# Patient Record
Sex: Female | Born: 1980 | Race: Black or African American | Hispanic: No | Marital: Single | Smoking: Never smoker
Health system: Southern US, Community
[De-identification: ages and names within clinical notes are randomized; demographics above are authoritative.]

## PROBLEM LIST (undated history)

## (undated) DIAGNOSIS — I10 Essential (primary) hypertension: Secondary | ICD-10-CM

## (undated) DIAGNOSIS — G43909 Migraine, unspecified, not intractable, without status migrainosus: Secondary | ICD-10-CM

## (undated) DIAGNOSIS — G8929 Other chronic pain: Secondary | ICD-10-CM

## (undated) DIAGNOSIS — K029 Dental caries, unspecified: Secondary | ICD-10-CM

## (undated) DIAGNOSIS — M549 Dorsalgia, unspecified: Secondary | ICD-10-CM

## (undated) DIAGNOSIS — K279 Peptic ulcer, site unspecified, unspecified as acute or chronic, without hemorrhage or perforation: Secondary | ICD-10-CM

## (undated) DIAGNOSIS — E079 Disorder of thyroid, unspecified: Secondary | ICD-10-CM

## (undated) DIAGNOSIS — N390 Urinary tract infection, site not specified: Secondary | ICD-10-CM

## (undated) DIAGNOSIS — K5090 Crohn's disease, unspecified, without complications: Secondary | ICD-10-CM

## (undated) DIAGNOSIS — G35 Multiple sclerosis: Secondary | ICD-10-CM

## (undated) HISTORY — PX: TUBAL LIGATION: SHX77

## (undated) HISTORY — PX: CHOLECYSTECTOMY: SHX55

## (undated) HISTORY — PX: MOUTH SURGERY: SHX715

---

## 2009-01-05 ENCOUNTER — Emergency Department (HOSPITAL_COMMUNITY): Admission: EM | Admit: 2009-01-05 | Discharge: 2009-01-05 | Payer: Self-pay | Admitting: Emergency Medicine

## 2009-02-27 ENCOUNTER — Emergency Department (HOSPITAL_COMMUNITY): Admission: EM | Admit: 2009-02-27 | Discharge: 2009-02-27 | Payer: Self-pay | Admitting: Emergency Medicine

## 2009-03-30 ENCOUNTER — Emergency Department (HOSPITAL_COMMUNITY): Admission: EM | Admit: 2009-03-30 | Discharge: 2009-03-30 | Payer: Self-pay | Admitting: Emergency Medicine

## 2011-05-15 ENCOUNTER — Emergency Department (HOSPITAL_COMMUNITY)
Admission: EM | Admit: 2011-05-15 | Discharge: 2011-05-15 | Disposition: A | Discharge status: Home / Self Care | Payer: Medicaid - Out of State | Attending: Emergency Medicine | Admitting: Emergency Medicine

## 2011-05-15 DIAGNOSIS — G35 Multiple sclerosis: Secondary | ICD-10-CM | POA: Insufficient documentation

## 2011-05-15 DIAGNOSIS — E039 Hypothyroidism, unspecified: Secondary | ICD-10-CM | POA: Insufficient documentation

## 2011-05-15 DIAGNOSIS — M545 Low back pain, unspecified: Secondary | ICD-10-CM | POA: Insufficient documentation

## 2011-05-26 ENCOUNTER — Encounter: Payer: Self-pay | Admitting: *Deleted

## 2011-05-26 ENCOUNTER — Emergency Department (HOSPITAL_COMMUNITY)
Admission: EM | Admit: 2011-05-26 | Discharge: 2011-05-26 | Disposition: A | Discharge status: Home / Self Care | Payer: Medicaid - Out of State | Attending: Emergency Medicine | Admitting: Emergency Medicine

## 2011-05-26 DIAGNOSIS — IMO0001 Reserved for inherently not codable concepts without codable children: Secondary | ICD-10-CM | POA: Insufficient documentation

## 2011-05-26 DIAGNOSIS — K0889 Other specified disorders of teeth and supporting structures: Secondary | ICD-10-CM

## 2011-05-26 DIAGNOSIS — R51 Headache: Secondary | ICD-10-CM | POA: Insufficient documentation

## 2011-05-26 DIAGNOSIS — M2550 Pain in unspecified joint: Secondary | ICD-10-CM | POA: Insufficient documentation

## 2011-05-26 DIAGNOSIS — K089 Disorder of teeth and supporting structures, unspecified: Secondary | ICD-10-CM | POA: Insufficient documentation

## 2011-05-26 DIAGNOSIS — H9209 Otalgia, unspecified ear: Secondary | ICD-10-CM | POA: Insufficient documentation

## 2011-05-26 HISTORY — DX: Multiple sclerosis: G35

## 2011-05-26 HISTORY — DX: Essential (primary) hypertension: I10

## 2011-05-26 HISTORY — DX: Disorder of thyroid, unspecified: E07.9

## 2011-05-26 MED ORDER — ERYTHROMYCIN BASE 250 MG PO TABS: ORAL_TABLET | ORAL | Status: DC

## 2011-05-26 MED ORDER — HYDROCODONE-ACETAMINOPHEN 5-325 MG PO TABS: ORAL_TABLET | ORAL | Status: DC

## 2011-05-26 MED ORDER — ERYTHROMYCIN BASE 250 MG PO TABS
250.0000 mg | ORAL_TABLET | Freq: Once | ORAL | Status: AC
  Administered 2011-05-26: 250 mg via ORAL
  Filled 2011-05-26: qty 1

## 2011-05-26 MED ORDER — HYDROCODONE-ACETAMINOPHEN 5-325 MG PO TABS
2.0000 | ORAL_TABLET | Freq: Once | ORAL | Status: AC
  Administered 2011-05-26: 2 via ORAL
  Filled 2011-05-26: qty 2

## 2011-05-26 MED ORDER — PROMETHAZINE HCL 12.5 MG PO TABS
12.5000 mg | ORAL_TABLET | Freq: Once | ORAL | Status: AC
  Administered 2011-05-26: 12.5 mg via ORAL
  Filled 2011-05-26: qty 1

## 2011-05-26 NOTE — ED Provider Notes (Signed)
History     CSN: [id number] Arrival date & time: 05/26/2011  6:45 PM  Chief Complaint  Patient presents with  . Dental Pain  . Otalgia  . Migraine    (Consider location/radiation/quality/duration/timing/severity/associated sxs/prior treatment) HPI Comments: Pt reports a molar broke on 10/5 while eating. She has had pain since that time. She now has tooth pain and headache.  Patient is a 30 y.o. female presenting with tooth pain. The history is provided by the patient.  Dental PainPrimary symptoms do not include shortness of breath or cough. The symptoms began 3 to 5 days ago. The symptoms are worsening. The symptoms occur constantly.  Additional symptoms include: dental sensitivity to temperature, gum tenderness and jaw pain. Additional symptoms do not include: trouble swallowing and nosebleeds.    Past Medical History  Diagnosis Date  . MS (multiple sclerosis)   . Thyroid disease   . Hypertension     Past Surgical History  Procedure Date  . Cholecystectomy   . Cesarean section     History reviewed. No pertinent family history.  History  Substance Use Topics  . Smoking status: Never Smoker   . Smokeless tobacco: Not on file  . Alcohol Use: No    OB History    Grav Para Term Preterm Abortions TAB SAB Ect Mult Living                  Review of Systems  Constitutional: Negative for activity change.       All ROS Neg except as noted in HPI  HENT: Positive for dental problem. Negative for nosebleeds, trouble swallowing and neck pain.   Eyes: Negative for photophobia and discharge.  Respiratory: Negative for cough, shortness of breath and wheezing.   Cardiovascular: Negative for chest pain and palpitations.  Gastrointestinal: Negative for abdominal pain and blood in stool.  Genitourinary: Negative for dysuria, frequency and hematuria.  Musculoskeletal: Positive for myalgias and arthralgias. Negative for back pain.  Skin: Negative.   Neurological: Negative for  dizziness, seizures and speech difficulty.  Psychiatric/Behavioral: Negative for hallucinations and confusion.    Allergies  Aspirin; Penicillins; Ultram; and Imitrex  Home Medications  No current outpatient prescriptions on file.  BP 138/79  Pulse 88  Temp(Src) 98.3 F (36.8 C) (Oral)  Resp 20  Ht 5\' 5"  (1.651 m)  Wt 258 lb (117.028 kg)  BMI 42.93 kg/m2  SpO2 100%  LMP 05/10/2011  Physical Exam  Nursing note and vitals reviewed. Constitutional: She is oriented to person, place, and time. She appears well-developed and well-nourished.  Non-toxic appearance.  HENT:  Head: Normocephalic.  Right Ear: Tympanic membrane and external ear normal.  Left Ear: Tympanic membrane and external ear normal.       Dental caries and pain of the right lower 1st molar.  Eyes: EOM and lids are normal. Pupils are equal, round, and reactive to light.  Neck: Normal range of motion. Neck supple. Carotid bruit is not present.  Cardiovascular: Normal rate, regular rhythm, normal heart sounds, intact distal pulses and normal pulses.   Pulmonary/Chest: Breath sounds normal. No respiratory distress.  Abdominal: Soft. Bowel sounds are normal. There is no tenderness. There is no guarding.  Musculoskeletal: Normal range of motion.  Lymphadenopathy:       Head (right side): No submandibular adenopathy present.       Head (left side): No submandibular adenopathy present.    She has no cervical adenopathy.  Neurological: She is alert and oriented to person, place, and  time. She has normal strength. No cranial nerve deficit or sensory deficit.  Skin: Skin is warm and dry.  Psychiatric: She has a normal mood and affect. Her speech is normal.    ED Course  Dental Date/Time: 05/26/2011 8:26 PM Performed by: Kathie Dike Authorized by: Kathie Dike Consent: Verbal consent obtained. Consent given by: patient Patient understanding: patient states understanding of the procedure being  performed Patient identity confirmed: verbally with patient Time out: Immediately prior to procedure a "time out" was called to verify the correct patient, procedure, equipment, support staff and site/side marked as required. Local anesthesia used: yes Anesthesia: local infiltration Local anesthetic: bupivacaine 0.5% with epinephrine Patient tolerance: Patient tolerated the procedure well with no immediate complications. Comments: Pain much improved after dental block. No complications.   (including critical care time)  Labs Reviewed - No data to display No results found.   Dx: Toothache   2. Headache.   MDM  I have reviewed nursing notes, vital signs, and all appropriate lab and imaging results for this patient.        Kathie Dike, [place] 05/26/11 2028

## 2011-05-26 NOTE — ED Notes (Signed)
Left in c/o family for transport.

## 2011-05-26 NOTE — ED Notes (Signed)
Pt c/o pain to her right lower jaw with migraine headache.

## 2011-05-26 NOTE — ED Provider Notes (Signed)
Medical screening examination/treatment/procedure(s) were performed by non-physician practitioner and as supervising physician I was immediately available for consultation/collaboration.  Gerhard Munch, MD 05/26/11 2201

## 2011-06-02 ENCOUNTER — Encounter (HOSPITAL_COMMUNITY): Payer: Self-pay

## 2011-06-02 ENCOUNTER — Emergency Department (HOSPITAL_COMMUNITY)
Admission: EM | Admit: 2011-06-02 | Discharge: 2011-06-02 | Disposition: A | Discharge status: Home / Self Care | Payer: Medicaid - Out of State | Attending: Emergency Medicine | Admitting: Emergency Medicine

## 2011-06-02 DIAGNOSIS — I10 Essential (primary) hypertension: Secondary | ICD-10-CM | POA: Insufficient documentation

## 2011-06-02 DIAGNOSIS — G35 Multiple sclerosis: Secondary | ICD-10-CM | POA: Insufficient documentation

## 2011-06-02 DIAGNOSIS — K089 Disorder of teeth and supporting structures, unspecified: Secondary | ICD-10-CM | POA: Insufficient documentation

## 2011-06-02 DIAGNOSIS — IMO0002 Reserved for concepts with insufficient information to code with codable children: Secondary | ICD-10-CM | POA: Insufficient documentation

## 2011-06-02 DIAGNOSIS — K0381 Cracked tooth: Secondary | ICD-10-CM | POA: Insufficient documentation

## 2011-06-02 DIAGNOSIS — E079 Disorder of thyroid, unspecified: Secondary | ICD-10-CM | POA: Insufficient documentation

## 2011-06-02 DIAGNOSIS — K029 Dental caries, unspecified: Secondary | ICD-10-CM | POA: Insufficient documentation

## 2011-06-02 DIAGNOSIS — K0889 Other specified disorders of teeth and supporting structures: Secondary | ICD-10-CM

## 2011-06-02 MED ORDER — OXYCODONE-ACETAMINOPHEN 5-325 MG PO TABS
1.0000 | ORAL_TABLET | Freq: Once | ORAL | Status: AC
  Administered 2011-06-02: 1 via ORAL
  Filled 2011-06-02: qty 1

## 2011-06-02 MED ORDER — CLINDAMYCIN HCL 150 MG PO CAPS: ORAL_CAPSULE | ORAL | Status: DC

## 2011-06-02 MED ORDER — OXYCODONE-ACETAMINOPHEN 5-325 MG PO TABS: ORAL_TABLET | ORAL | Status: DC

## 2011-06-02 MED ORDER — CLINDAMYCIN HCL 150 MG PO CAPS
300.0000 mg | ORAL_CAPSULE | Freq: Once | ORAL | Status: AC
  Administered 2011-06-02: 300 mg via ORAL
  Filled 2011-06-02: qty 2

## 2011-06-02 MED ORDER — ONDANSETRON HCL 8 MG PO TABS: 8.0000 mg | ORAL_TABLET | Freq: Four times a day (QID) | ORAL | Status: AC

## 2011-06-02 MED ORDER — ONDANSETRON 4 MG PO TBDP
4.0000 mg | ORAL_TABLET | Freq: Once | ORAL | Status: AC
  Administered 2011-06-02: 4 mg via ORAL
  Filled 2011-06-02: qty 1

## 2011-06-02 NOTE — ED Notes (Signed)
Pt states was kicked accidentally in mouth by her twin boys on Thursday causing her to "break her lower right back molar".  Pt states has an appointment with Dr Manson Passey on the 23 rd to have tooth extracted.

## 2011-06-02 NOTE — ED Notes (Signed)
Pt reports cracking her rt wisdom tooth and molar last Thursday while eating.  Pt reports severe pain and swelling to the right side of her face.  Pt reports ha and some nausea. Pt has contacted a dentist but is unable to see him until Oct 23rd.

## 2011-06-02 NOTE — ED Provider Notes (Signed)
History     CSN: [id number] Arrival date & time: 06/02/2011  9:02 PM   First MD Initiated Contact with Patient 06/02/11 2121      Chief Complaint  Patient presents with  . Dental Pain  . Dental Injury    (Consider location/radiation/quality/duration/timing/severity/associated sxs/prior treatment) HPI Comments: Pt seen here ~ 5 days ago.  Pain is worsening.  Has appt with dr. Manson Passey for extraction of 2 teeth 06-09-11.  Patient is a 30 y.o. female presenting with tooth pain and dental injury. The history is provided by the patient. No language interpreter was used.  Dental PainThe primary symptoms include mouth pain and dental injury. Primary symptoms do not include fever. The symptoms began 5 to 7 days ago. The symptoms are worsening. The symptoms occur constantly.  Additional symptoms include: jaw pain. Additional symptoms do not include: facial swelling.   Dental Injury Pertinent negatives include no fever.    Past Medical History  Diagnosis Date  . MS (multiple sclerosis)   . Thyroid disease   . Hypertension     Past Surgical History  Procedure Date  . Cholecystectomy   . Cesarean section     No family history on file.  History  Substance Use Topics  . Smoking status: Never Smoker   . Smokeless tobacco: Not on file  . Alcohol Use: No    OB History    Grav Para Term Preterm Abortions TAB SAB Ect Mult Living                  Review of Systems  Constitutional: Negative for fever.  HENT: Positive for dental problem. Negative for facial swelling.   All other systems reviewed and are negative.    Allergies  Aspirin; Penicillins; Toradol; Ultram; and Imitrex  Home Medications     BP 124/79  Pulse 93  Temp(Src) 98.2 F (36.8 C) (Oral)  Resp 16  Ht 5\' 5"  (1.651 m)  Wt 258 lb (117.028 kg)  BMI 42.93 kg/m2  SpO2 100%  LMP 05/10/2011  Physical Exam  Nursing note and vitals reviewed. Constitutional: She is oriented to person, place, and time. She  appears well-developed and well-nourished. No distress.  HENT:  Head: Normocephalic and atraumatic.  Mouth/Throat: Mucous membranes are normal. Dental caries present. No uvula swelling. No oropharyngeal exudate, posterior oropharyngeal edema, posterior oropharyngeal erythema or tonsillar abscesses.    Eyes: EOM are normal.  Neck: Normal range of motion.  Cardiovascular: Normal rate, regular rhythm and normal heart sounds.   Pulmonary/Chest: Effort normal and breath sounds normal.  Abdominal: Soft. She exhibits no distension. There is no tenderness.  Musculoskeletal: Normal range of motion.  Neurological: She is alert and oriented to person, place, and time.  Skin: Skin is warm and dry.  Psychiatric: She has a normal mood and affect. Judgment normal.    ED Course  Procedures (including critical care time)  Labs Reviewed - No data to display No results found.   No diagnosis found.    MDM          Worthy Rancher, PA 06/02/11 [phone]

## 2011-06-05 ENCOUNTER — Emergency Department (HOSPITAL_COMMUNITY)
Admission: EM | Admit: 2011-06-05 | Discharge: 2011-06-05 | Disposition: A | Discharge status: Home / Self Care | Payer: Medicaid - Out of State | Attending: Emergency Medicine | Admitting: Emergency Medicine

## 2011-06-05 ENCOUNTER — Encounter (HOSPITAL_COMMUNITY): Payer: Self-pay | Admitting: Emergency Medicine

## 2011-06-05 DIAGNOSIS — G35 Multiple sclerosis: Secondary | ICD-10-CM | POA: Insufficient documentation

## 2011-06-05 DIAGNOSIS — K089 Disorder of teeth and supporting structures, unspecified: Secondary | ICD-10-CM | POA: Insufficient documentation

## 2011-06-05 DIAGNOSIS — K0889 Other specified disorders of teeth and supporting structures: Secondary | ICD-10-CM

## 2011-06-05 DIAGNOSIS — E079 Disorder of thyroid, unspecified: Secondary | ICD-10-CM | POA: Insufficient documentation

## 2011-06-05 DIAGNOSIS — K029 Dental caries, unspecified: Secondary | ICD-10-CM | POA: Insufficient documentation

## 2011-06-05 DIAGNOSIS — I10 Essential (primary) hypertension: Secondary | ICD-10-CM | POA: Insufficient documentation

## 2011-06-05 MED ORDER — ONDANSETRON 8 MG PO TBDP: 8.0000 mg | ORAL_TABLET | Freq: Once | ORAL | Status: DC

## 2011-06-05 MED ORDER — ONDANSETRON 8 MG PO TBDP
8.0000 mg | ORAL_TABLET | Freq: Once | ORAL | Status: AC
  Administered 2011-06-05: 8 mg via ORAL
  Filled 2011-06-05 (×2): qty 1

## 2011-06-05 MED ORDER — HYDROCODONE-ACETAMINOPHEN 5-325 MG PO TABS: 1.0000 | ORAL_TABLET | ORAL | Status: AC | PRN

## 2011-06-05 MED ORDER — HYDROCODONE-ACETAMINOPHEN 5-325 MG PO TABS
1.0000 | ORAL_TABLET | Freq: Once | ORAL | Status: AC
  Administered 2011-06-05: 1 via ORAL
  Filled 2011-06-05: qty 1

## 2011-06-05 NOTE — ED Provider Notes (Signed)
Medical screening examination/treatment/procedure(s) were performed by non-physician practitioner and as supervising physician I was immediately available for consultation/collaboration.  Donnetta Hutching, MD 06/05/11 [phone]

## 2011-06-05 NOTE — ED Provider Notes (Addendum)
History     CSN: [id number] Arrival date & time: 06/05/2011  2:54 PM   First MD Initiated Contact with Patient 06/05/11 1534      Chief Complaint  Patient presents with  . Dental Pain    (Consider location/radiation/quality/duration/timing/severity/associated sxs/prior treatment) HPI Comments: She was seen here 3 days ago for dental pain,  Fracture and infection and was place on oxycodone and clindamycin for pain.   She reports hives and itching along with chest pressure which lasts for about 2 hours after taking this medicine.  Her last dose and subsequent symptoms were last night,  Has had a dose of clindamycin today with no symptoms.  She is desirous of replacing the oxycodone with hydrocodone,  Which she has been able to tolerate in the past.  She denies any symptoms at present except for dental pain.  She will be seeing an oral surgeon next week for dental extraction.  Patient is a 30 y.o. female presenting with tooth pain. The history is provided by the patient.  Dental PainThe primary symptoms include mouth pain. Primary symptoms do not include headaches, fever, shortness of breath or sore throat. The symptoms began 3 to 5 days ago. The symptoms are unchanged. The symptoms occur constantly.    Past Medical History  Diagnosis Date  . MS (multiple sclerosis)   . Thyroid disease   . Hypertension     Past Surgical History  Procedure Date  . Cholecystectomy   . Cesarean section     History reviewed. No pertinent family history.  History  Substance Use Topics  . Smoking status: Never Smoker   . Smokeless tobacco: Not on file  . Alcohol Use: No    OB History    Grav Para Term Preterm Abortions TAB SAB Ect Mult Living                  Review of Systems  Constitutional: Negative for fever.  HENT: Positive for dental problem. Negative for congestion, sore throat and neck pain.   Eyes: Negative.   Respiratory: Negative for chest tightness and shortness of breath.     Cardiovascular: Negative for chest pain.  Gastrointestinal: Negative for nausea and abdominal pain.  Genitourinary: Negative.   Musculoskeletal: Negative for joint swelling and arthralgias.  Skin: Negative.  Negative for rash and wound.  Neurological: Negative for dizziness, weakness, light-headedness, numbness and headaches.  Hematological: Negative.   Psychiatric/Behavioral: Negative.     Allergies  Aspirin; Penicillins; Toradol; Ultram; and Imitrex  Home Medications     BP 139/46  Pulse 95  Temp(Src) 98.4 F (36.9 C) (Oral)  Resp 20  Ht 5\' 5"  (1.651 m)  Wt 268 lb (121.564 kg)  BMI 44.60 kg/m2  SpO2 100%  LMP 05/10/2011  Physical Exam  Constitutional: She is oriented to person, place, and time. She appears well-developed and well-nourished. No distress.  HENT:  Head: Normocephalic and atraumatic.  Right Ear: Tympanic membrane and external ear normal.  Left Ear: Tympanic membrane and external ear normal.  Mouth/Throat: Oropharynx is clear and moist and mucous membranes are normal. No oral lesions. Dental caries present.    Eyes: Conjunctivae are normal.  Neck: Normal range of motion. Neck supple.  Cardiovascular: Normal rate and normal heart sounds.   Pulmonary/Chest: Effort normal.  Abdominal: She exhibits no distension.  Musculoskeletal: Normal range of motion.  Lymphadenopathy:    She has no cervical adenopathy.  Neurological: She is alert and oriented to person, place, and time.  Skin:  Skin is warm and dry. No erythema.  Psychiatric: She has a normal mood and affect.    ED Course  Procedures (including critical care time)  Labs Reviewed - No data to display No results found.   1. Pain, dental       MDM  Dental fracture/decay with intolerance to oxycodone.        Candis Musa, PA 06/05/11 1625  Candis Musa, PA 06/09/11 1343

## 2011-06-05 NOTE — ED Notes (Signed)
Pt seen in ed x 3 days ago for toothache. Received prescription for oxycodone-acetaminophen and c/o hives/itching/sob while taking it. Pt here to have pain medication switched. nad noted.

## 2011-06-11 ENCOUNTER — Emergency Department (HOSPITAL_COMMUNITY)
Admission: EM | Admit: 2011-06-11 | Discharge: 2011-06-11 | Disposition: A | Discharge status: Home / Self Care | Payer: Medicaid - Out of State | Attending: Emergency Medicine | Admitting: Emergency Medicine

## 2011-06-11 DIAGNOSIS — E039 Hypothyroidism, unspecified: Secondary | ICD-10-CM | POA: Insufficient documentation

## 2011-06-11 DIAGNOSIS — M545 Low back pain, unspecified: Secondary | ICD-10-CM | POA: Insufficient documentation

## 2011-06-11 DIAGNOSIS — G8929 Other chronic pain: Secondary | ICD-10-CM | POA: Insufficient documentation

## 2011-06-11 DIAGNOSIS — G35 Multiple sclerosis: Secondary | ICD-10-CM | POA: Insufficient documentation

## 2011-06-11 DIAGNOSIS — R112 Nausea with vomiting, unspecified: Secondary | ICD-10-CM | POA: Insufficient documentation

## 2011-06-16 ENCOUNTER — Encounter (HOSPITAL_COMMUNITY): Payer: Self-pay | Admitting: Emergency Medicine

## 2011-06-16 ENCOUNTER — Emergency Department (HOSPITAL_COMMUNITY)
Admission: EM | Admit: 2011-06-16 | Discharge: 2011-06-16 | Disposition: A | Discharge status: Home / Self Care | Payer: Medicaid - Out of State | Attending: Emergency Medicine | Admitting: Emergency Medicine

## 2011-06-16 DIAGNOSIS — S39012A Strain of muscle, fascia and tendon of lower back, initial encounter: Secondary | ICD-10-CM

## 2011-06-16 DIAGNOSIS — M545 Low back pain, unspecified: Secondary | ICD-10-CM | POA: Insufficient documentation

## 2011-06-16 DIAGNOSIS — K089 Disorder of teeth and supporting structures, unspecified: Secondary | ICD-10-CM | POA: Insufficient documentation

## 2011-06-16 DIAGNOSIS — K0889 Other specified disorders of teeth and supporting structures: Secondary | ICD-10-CM

## 2011-06-16 MED ORDER — METHOCARBAMOL 500 MG PO TABS: ORAL_TABLET | ORAL | Status: DC

## 2011-06-16 MED ORDER — OXYCODONE-ACETAMINOPHEN 5-325 MG PO TABS: 1.0000 | ORAL_TABLET | ORAL | Status: AC | PRN

## 2011-06-16 NOTE — ED Notes (Signed)
Advocate talking with patient about wait

## 2011-06-16 NOTE — ED Provider Notes (Signed)
History     CSN: [id number] Arrival date & time: 06/16/2011 11:07 AM   First MD Initiated Contact with Patient 06/16/11 1242      Chief Complaint  Patient presents with  . Dental Pain  . Back Pain    (Consider location/radiation/quality/duration/timing/severity/associated sxs/prior treatment) HPI Comments: Patient c/o dental pain and low back pain for several days.  States the pain to her back radiates across her back into her right buttocks, hip and lateral right thigh.  She denies fever, numbness, weakness, injury or incontinence  Patient is a 30 y.o. female presenting with tooth pain. The history is provided by the patient.  Dental PainThe primary symptoms include mouth pain. Primary symptoms do not include dental injury, oral bleeding, oral lesions, headaches, fever, shortness of breath, sore throat, angioedema or cough. The symptoms began 3 to 5 days ago. The symptoms are unchanged. The symptoms are recurrent. The symptoms occur constantly.  Affected locations include: teeth. The mouth pain is currently at 9/10.  Additional symptoms include: dental sensitivity to temperature, gum swelling and gum tenderness. Additional symptoms do not include: purulent gums, trismus, jaw pain, facial swelling, trouble swallowing, pain with swallowing, drooling, ear pain, swollen glands and fatigue. Medical issues include: periodontal disease.    Past Medical History  Diagnosis Date  . MS (multiple sclerosis)   . Thyroid disease   . Hypertension     Past Surgical History  Procedure Date  . Cholecystectomy   . Cesarean section     History reviewed. No pertinent family history.  History  Substance Use Topics  . Smoking status: Never Smoker   . Smokeless tobacco: Not on file  . Alcohol Use: No    OB History    Grav Para Term Preterm Abortions TAB SAB Ect Mult Living                  Review of Systems  Constitutional: Negative for fever, chills and fatigue.  HENT: Positive for  dental problem. Negative for ear pain, sore throat, facial swelling, drooling, trouble swallowing, neck pain and neck stiffness.   Respiratory: Negative for cough, shortness of breath and wheezing.   Cardiovascular: Negative for chest pain and palpitations.  Gastrointestinal: Negative for nausea, vomiting and blood in stool.  Genitourinary: Negative for dysuria, hematuria and flank pain.  Musculoskeletal: Positive for back pain. Negative for myalgias, joint swelling, arthralgias and gait problem.  Skin: Negative.  Negative for rash.  Neurological: Negative for dizziness, facial asymmetry, weakness, numbness and headaches.  Hematological: Negative for adenopathy. Does not bruise/bleed easily.  All other systems reviewed and are negative.    Allergies  Aspirin; Penicillins; Ultram; Imitrex; and Toradol  Home Medications  Aspirin-Acetaminophen-Caffeine (GOODY HEADACHE PO) Take 1 packet by mouth as needed. For dental pain Historical Provider,  MD clindamycin (CLEOCIN) 150 MG capsule Take 300 mg by mouth 3 (three) times daily. Richard Paul Half, PA  ibuprofen (ADVIL,MOTRIN) 200 MG tablet Take 800 mg by mouth every 4 (four) hours as needed. For dental pain Historical Provider, MD     BP 136/73  Pulse 70  Temp(Src) 98.3 F (36.8 C) (Oral)  Resp 18  Ht 5\' 5"  (1.651 m)  Wt 268 lb (121.564 kg)  BMI 44.60 kg/m2  SpO2 100%  LMP 06/09/2011  Physical Exam  Nursing note and vitals reviewed. Constitutional: She is oriented to person, place, and time. She appears well-developed and well-nourished. No distress.  HENT:  Head: Normocephalic and atraumatic. No trismus in the jaw.  Right Ear: Tympanic membrane normal.  Left Ear: Tympanic membrane normal.  Mouth/Throat: Uvula is midline, oropharynx is clear and moist and mucous membranes are normal. Dental caries present. No dental abscesses or uvula swelling.       Multiple dental caries of the right lower molars..  No obvious dental abscess.  No  trismus  Neck: Normal range of motion. Neck supple.  Cardiovascular: Normal rate, regular rhythm and normal heart sounds.   Pulmonary/Chest: Effort normal and breath sounds normal. No respiratory distress. She exhibits no tenderness.  Musculoskeletal: Normal range of motion. She exhibits no tenderness.       Lumbar back: She exhibits tenderness. She exhibits normal range of motion, no bony tenderness, no swelling, no spasm and normal pulse.       ttp of the lumbar paraspinal muscles and right SI joint space.  No focal neuro deficits.  Patient ambulates w/o difficulty  Lymphadenopathy:    She has no cervical adenopathy.  Neurological: She is alert and oriented to person, place, and time. No cranial nerve deficit or sensory deficit. She exhibits normal muscle tone. Coordination and gait normal.  Reflex Scores:      Patellar reflexes are 2+ on the right side and 2+ on the left side.      Achilles reflexes are 2+ on the right side and 2+ on the left side. Skin: Skin is warm and dry.    ED Course  Procedures (including critical care time)       MDM    1:26 PM multiple dental caries of the right lower molars.  No obvious dental abscess, trismus or facial swelling.  I have advised pt that she will need further f/u with her dentist , that her chronic dental pain cannot be managed in the ED.  Pt verbalized understanding and agreed with care plan  Patient / Family / Caregiver understand and agree with initial ED impression and plan with expectations set for ED visit.         Desmin Daleo L. Codey Burling, PA 06/18/11 1316

## 2011-06-16 NOTE — ED Notes (Signed)
Pt c/o dental pain and lower back pain since Friday.

## 2011-06-16 NOTE — ED Provider Notes (Signed)
Medical screening examination/treatment/procedure(s) were performed by non-physician practitioner and as supervising physician I was immediately available for consultation/collaboration.  Donnetta Hutching, MD 06/16/11 [phone]

## 2011-06-16 NOTE — ED Notes (Signed)
C/o dental pain and states she has an appointment with a dentist next week, also has back pain, states she hurt her back running after her children

## 2011-06-19 NOTE — ED Provider Notes (Signed)
Medical screening examination/treatment/procedure(s) were performed by non-physician practitioner and as supervising physician I was immediately available for consultation/collaboration. Devoria Albe, MD, Armando Gang   Ward Givens, MD 06/19/11 [phone]

## 2011-06-24 NOTE — ED Provider Notes (Signed)
Medical screening examination/treatment/procedure(s) were performed by non-physician practitioner and as supervising physician I was immediately available for consultation/collaboration.   Gerhard Munch, MD 06/24/11 [phone]

## 2011-08-01 ENCOUNTER — Emergency Department (HOSPITAL_COMMUNITY)
Admission: EM | Admit: 2011-08-01 | Discharge: 2011-08-01 | Disposition: A | Discharge status: Home / Self Care | Payer: Medicaid - Out of State | Attending: Emergency Medicine | Admitting: Emergency Medicine

## 2011-08-01 ENCOUNTER — Encounter (HOSPITAL_COMMUNITY): Payer: Self-pay

## 2011-08-01 DIAGNOSIS — G8929 Other chronic pain: Secondary | ICD-10-CM | POA: Insufficient documentation

## 2011-08-01 DIAGNOSIS — G35 Multiple sclerosis: Secondary | ICD-10-CM | POA: Insufficient documentation

## 2011-08-01 DIAGNOSIS — M545 Low back pain, unspecified: Secondary | ICD-10-CM | POA: Insufficient documentation

## 2011-08-01 DIAGNOSIS — I10 Essential (primary) hypertension: Secondary | ICD-10-CM | POA: Insufficient documentation

## 2011-08-01 DIAGNOSIS — M546 Pain in thoracic spine: Secondary | ICD-10-CM | POA: Insufficient documentation

## 2011-08-01 DIAGNOSIS — G43909 Migraine, unspecified, not intractable, without status migrainosus: Secondary | ICD-10-CM | POA: Insufficient documentation

## 2011-08-01 MED ORDER — DROPERIDOL 2.5 MG/ML IJ SOLN
2.5000 mg | INTRAMUSCULAR | Status: AC
  Administered 2011-08-01: 2.5 mg via INTRAVENOUS
  Filled 2011-08-01: qty 1

## 2011-08-01 MED ORDER — SODIUM CHLORIDE 0.9 % IV BOLUS (SEPSIS)
1000.0000 mL | Freq: Once | INTRAVENOUS | Status: AC
  Administered 2011-08-01: 1000 mL via INTRAVENOUS

## 2011-08-01 NOTE — ED Provider Notes (Signed)
History     CSN: [id number] Arrival date & time: 08/01/2011 11:25 AM   First MD Initiated Contact with Patient 08/01/11 1157      Chief Complaint  Patient presents with  . Back Pain    (Consider location/radiation/quality/duration/timing/severity/associated sxs/prior treatment) HPI Patient states that she is moving and had onset of back pain yesterday while doing same. Pt states that she also has 29 year old twins that she was picking up. Pt states that pain is across her lower back and into her right side. Pt took 800 mg Ibuprofen this am at 0900 for pain. Pt denies any relief of pain. Pt has no pain medication at this time...states that she just moved here. Pt states that today she also woke up with a Headache. Pt has hx of same. Pt has no neuro deficits.  Past Medical History  Diagnosis Date  . MS (multiple sclerosis)   . Thyroid disease   . Hypertension     Past Surgical History  Procedure Date  . Cholecystectomy   . Cesarean section     History reviewed. No pertinent family history.      History  Substance Use Topics  . Smoking status: Never Smoker   . Smokeless tobacco: Not on file  . Alcohol Use: No    OB History    Grav Para Term Preterm Abortions TAB SAB Ect Mult Living            3      Review of Systems All other symptoms are negative except as noted in history of present illness Allergies  Aspirin; Penicillins; Ultram; Imitrex; Other; and Toradol  Home Medications     BP 132/80  Pulse 87  Temp(Src) 97.9 F (36.6 C) (Oral)  Resp 20  Ht 5\' 5"  (1.651 m)  Wt 268 lb (121.564 kg)  BMI 44.60 kg/m2  SpO2 100%  LMP 07/26/2011  Physical Exam  Nursing note and vitals reviewed. Constitutional: She is oriented to person, place, and time. She appears well-developed and well-nourished. No distress.  HENT:  Head: Normocephalic and atraumatic.  Eyes: Pupils are equal, round, and reactive to light.  Neck: Normal range of motion.  Cardiovascular:  Normal rate and intact distal pulses.   Pulmonary/Chest: No respiratory distress.  Abdominal: Normal appearance. She exhibits no distension.  Musculoskeletal: Normal range of motion.       Thoracic back: She exhibits tenderness and pain.  Neurological: She is alert and oriented to person, place, and time. No cranial nerve deficit. GCS eye subscore is 4. GCS verbal subscore is 5. GCS motor subscore is 6.  Reflex Scores:      Patellar reflexes are 4+ on the right side and 4+ on the left side. Skin: Skin is warm and dry. No rash noted.  Psychiatric: She has a normal mood and affect. Her behavior is normal.    ED Course  Procedures (including critical care time)  Labs Reviewed - No data to display No results found.   1. Migraine   2. Chronic back pain       MDM   Pt. Transferred to [facility] for treatment of migraine  Left shortly after  See note by [facility]           Nelia Shi, MD 08/02/11 [phone]

## 2011-08-01 NOTE — ED Provider Notes (Signed)
2:25 PM Patient moved to [facility] holding for treatment of migraine.  Patient states her daughter is sick at school and she needs to go home, declines staying for any further treatment of her migraine.  Patient requests pain medication for back at home - I have discussed this with Dr Radford Pax and he agrees with nonnarcotic medications.  Patient is allergic to ultram, aspirin, and toradol.  Patient states she is unable to see her assigned PCP until February.  Will give [facility] information for closer primary care follow up.   Dillard [place], [place] 08/01/11 [phone]

## 2011-08-01 NOTE — ED Notes (Signed)
Patient states that she is moving and had onset of back pain yesterday while doing same.  Pt states that she also has 29 year old twins that she was picking up.  Pt states that pain is across her lower back and into her right side.  Pt took 800 mg Ibuprofen this am at 0900 for pain.  Pt denies any relief of pain. Pt has no pain medication at this time...states that she just moved here.  Pt states that today she also woke up with a Headache.  Pt has hx of same.  Pt has no neuro deficits.

## 2011-08-01 NOTE — ED Notes (Signed)
Complaining of back pain. Requesting something for her back. [facility] notified

## 2011-08-01 NOTE — ED Notes (Signed)
Pt having mid back pain which began yesterday ,denies any injuries or sob.  Pt. Also has a migraine which began this am and has photophobia, n/v

## 2011-12-25 ENCOUNTER — Encounter (HOSPITAL_COMMUNITY): Payer: Self-pay | Admitting: *Deleted

## 2011-12-25 ENCOUNTER — Emergency Department (HOSPITAL_COMMUNITY)
Admission: EM | Admit: 2011-12-25 | Discharge: 2011-12-25 | Disposition: A | Discharge status: Home / Self Care | Payer: Medicaid - Out of State | Attending: Emergency Medicine | Admitting: Emergency Medicine

## 2011-12-25 DIAGNOSIS — K089 Disorder of teeth and supporting structures, unspecified: Secondary | ICD-10-CM | POA: Insufficient documentation

## 2011-12-25 DIAGNOSIS — G35 Multiple sclerosis: Secondary | ICD-10-CM | POA: Insufficient documentation

## 2011-12-25 DIAGNOSIS — K0889 Other specified disorders of teeth and supporting structures: Secondary | ICD-10-CM

## 2011-12-25 DIAGNOSIS — K08109 Complete loss of teeth, unspecified cause, unspecified class: Secondary | ICD-10-CM | POA: Insufficient documentation

## 2011-12-25 DIAGNOSIS — K08409 Partial loss of teeth, unspecified cause, unspecified class: Secondary | ICD-10-CM | POA: Insufficient documentation

## 2011-12-25 HISTORY — DX: Peptic ulcer, site unspecified, unspecified as acute or chronic, without hemorrhage or perforation: K27.9

## 2011-12-25 MED ORDER — HYDROCODONE-ACETAMINOPHEN 7.5-500 MG PO TABS: 1.0000 | ORAL_TABLET | Freq: Four times a day (QID) | ORAL | Status: AC | PRN

## 2011-12-25 MED ORDER — HYDROCODONE-ACETAMINOPHEN 5-325 MG PO TABS
2.0000 | ORAL_TABLET | Freq: Once | ORAL | Status: AC
  Administered 2011-12-25: 2 via ORAL
  Filled 2011-12-25: qty 2

## 2011-12-25 NOTE — ED Provider Notes (Signed)
History     CSN: [id number]  Arrival date & time 12/25/11  1718   First MD Initiated Contact with Patient 12/25/11 1729      Chief Complaint  Patient presents with  . Dental Pain    (Consider location/radiation/quality/duration/timing/severity/associated sxs/prior treatment) HPI Comments: Patient presents for treatment of increasing mouth pain since she had multiple dental extractions last week.  She is scheduled to see her dentist again in 5 days for an additional extraction of the tooth that had infection and so was not able to be pulled last week.  She has just completed a course of clindamycin and she denies swelling, fevers or chills.  She also denies drainage from any of the dental sites.  Pain is constant and sharp she has difficulty eating secondary to pain.  She has taken ibuprofen and Tylenol with no significant improvement.  The history is provided by the patient.    Past Medical History  Diagnosis Date  . MS (multiple sclerosis)   . Thyroid disease   . Hypertension   . Peptic ulcer     Past Surgical History  Procedure Date  . Cholecystectomy   . Cesarean section     History reviewed. No pertinent family history.  History  Substance Use Topics  . Smoking status: Never Smoker   . Smokeless tobacco: Not on file  . Alcohol Use: No    OB History    Grav Para Term Preterm Abortions TAB SAB Ect Mult Living            3      Review of Systems  Constitutional: Negative for fever.  HENT: Positive for dental problem. Negative for sore throat, facial swelling, neck pain and neck stiffness.   Respiratory: Negative for shortness of breath.     Allergies  Aspirin; Nsaids; Penicillins; Ultram; Imitrex; Ketorolac tromethamine; and Other  Home Medications   Current Outpatient Rx  Name Route Sig Dispense Refill  . GOODY HEADACHE PO Oral Take 1 packet by mouth as needed. For dental pain     . IBUPROFEN 200 MG PO TABS Oral Take 800 mg by mouth every 4 (four)  hours as needed. For dental pain    . INTERFERON BETA-1B 0.3 MG [place] SOLR Subcutaneous Inject 0.25 mg into the skin every other day.    Marland Kitchen HYDROCODONE-ACETAMINOPHEN 7.5-500 MG PO TABS Oral Take 1 tablet by mouth every 6 (six) hours as needed for pain. 24 tablet 0    BP 128/71  Pulse 102  Temp(Src) 98.7 F (37.1 C) (Oral)  Resp 20  Ht 5\' 5"  (1.651 m)  Wt 268 lb (121.564 kg)  BMI 44.60 kg/m2  SpO2 100%  LMP 12/03/2011  Physical Exam  Constitutional: She is oriented to person, place, and time. She appears well-developed and well-nourished. No distress.  HENT:  Head: Normocephalic and atraumatic.  Right Ear: Tympanic membrane and external ear normal.  Left Ear: Tympanic membrane and external ear normal.  Mouth/Throat: Oropharynx is clear and moist and mucous membranes are normal. No oral lesions. No oropharyngeal exudate.       Several extraction sites of right lower molars appreciate which appear well-healing.  Right upper second molar socket appears with moderate erythema and tenderness, no purulence noted.  There is no clot present.  Eyes: Conjunctivae are normal.  Neck: Normal range of motion. Neck supple.  Cardiovascular: Normal rate and normal heart sounds.   Pulmonary/Chest: Effort normal.  Abdominal: She exhibits no distension.  Musculoskeletal: Normal range of  motion.  Lymphadenopathy:    She has no cervical adenopathy.  Neurological: She is alert and oriented to person, place, and time.  Skin: Skin is warm and dry. No erythema.  Psychiatric: She has a normal mood and affect.    ED Course  Procedures (including critical care time)  Labs Reviewed - No data to display No results found.   1. Pain, dental       MDM  Hydrocodone 7.5/500 mg #24 prescribed.  Patient encouraged to maintain a liquid or soft diet as tolerated.  She is to see her dentist in 5 days.  No evidence for infection at this site.        Burgess Amor, [place] 12/25/11 [phone]

## 2011-12-25 NOTE — ED Provider Notes (Signed)
Medical screening examination/treatment/procedure(s) were performed by non-physician practitioner and as supervising physician I was immediately available for consultation/collaboration.  Tiearra Colwell, MD 12/25/11 2245 

## 2011-12-25 NOTE — ED Notes (Signed)
Pt presents with post surgical dental pain x 10 days. Pt states pain is so severe that it is making her nausea. Pt had 8 teeth removed , both upper and lower on rt side. Pt believes she has dry socket. Is unable to see dentist today. Pt also is out of pain medication. Antibiotic therapy was completed.

## 2011-12-25 NOTE — ED Notes (Signed)
Had mult teeth extracted last week, pain since then.

## 2011-12-25 NOTE — Discharge Instructions (Signed)
Dental Pain A tooth ache may be caused by cavities (tooth decay). Cavities expose the nerve of the tooth to air and hot or cold temperatures. It may come from an infection or abscess (also called a boil or furuncle) around your tooth. It is also often caused by dental caries (tooth decay). This causes the pain you are having. DIAGNOSIS  Your caregiver can diagnose this problem by exam. TREATMENT   If caused by an infection, it may be treated with medications which kill germs (antibiotics) and pain medications as prescribed by your caregiver. Take medications as directed.   Only take over-the-counter or prescription medicines for pain, discomfort, or fever as directed by your caregiver.   Whether the tooth ache today is caused by infection or dental disease, you should see your dentist as soon as possible for further care.  SEEK MEDICAL CARE IF: The exam and treatment you received today has been provided on an emergency basis only. This is not a substitute for complete medical or dental care. If your problem worsens or new problems (symptoms) appear, and you are unable to meet with your dentist, call or return to this location. SEEK IMMEDIATE MEDICAL CARE IF:   You have a fever.   You develop redness and swelling of your face, jaw, or neck.   You are unable to open your mouth.   You have severe pain uncontrolled by pain medicine.  MAKE SURE YOU:   Understand these instructions.   Will watch your condition.   Will get help right away if you are not doing well or get worse.  Document Released: 08/03/2005 Document Revised: 07/23/2011 Document Reviewed: 03/21/2008 [facility] Patient Information 2012 [place], [place].    You may use the medication prescribed for pain relief but use caution as this medicine will make you drowsy.  Do not drive within 4 hours of taking.  See your dentist on Wednesday as already planned.

## 2012-01-09 ENCOUNTER — Emergency Department (HOSPITAL_COMMUNITY)
Admission: EM | Admit: 2012-01-09 | Discharge: 2012-01-09 | Disposition: A | Discharge status: Home / Self Care | Payer: Medicaid - Out of State | Attending: Emergency Medicine | Admitting: Emergency Medicine

## 2012-01-09 ENCOUNTER — Encounter (HOSPITAL_COMMUNITY): Payer: Self-pay

## 2012-01-09 DIAGNOSIS — S335XXA Sprain of ligaments of lumbar spine, initial encounter: Secondary | ICD-10-CM | POA: Insufficient documentation

## 2012-01-09 DIAGNOSIS — I10 Essential (primary) hypertension: Secondary | ICD-10-CM | POA: Insufficient documentation

## 2012-01-09 DIAGNOSIS — K089 Disorder of teeth and supporting structures, unspecified: Secondary | ICD-10-CM | POA: Insufficient documentation

## 2012-01-09 DIAGNOSIS — K0889 Other specified disorders of teeth and supporting structures: Secondary | ICD-10-CM

## 2012-01-09 DIAGNOSIS — Y9389 Activity, other specified: Secondary | ICD-10-CM | POA: Insufficient documentation

## 2012-01-09 DIAGNOSIS — Y998 Other external cause status: Secondary | ICD-10-CM | POA: Insufficient documentation

## 2012-01-09 DIAGNOSIS — G35 Multiple sclerosis: Secondary | ICD-10-CM | POA: Insufficient documentation

## 2012-01-09 DIAGNOSIS — E079 Disorder of thyroid, unspecified: Secondary | ICD-10-CM | POA: Insufficient documentation

## 2012-01-09 DIAGNOSIS — S39012A Strain of muscle, fascia and tendon of lower back, initial encounter: Secondary | ICD-10-CM

## 2012-01-09 DIAGNOSIS — X500XXA Overexertion from strenuous movement or load, initial encounter: Secondary | ICD-10-CM | POA: Insufficient documentation

## 2012-01-09 MED ORDER — ONDANSETRON 8 MG PO TBDP: 8.0000 mg | ORAL_TABLET | Freq: Once | ORAL | Status: AC

## 2012-01-09 MED ORDER — HYDROCODONE-ACETAMINOPHEN 5-325 MG PO TABS
2.0000 | ORAL_TABLET | Freq: Once | ORAL | Status: AC
  Administered 2012-01-09: 2 via ORAL
  Filled 2012-01-09: qty 2

## 2012-01-09 MED ORDER — ONDANSETRON 8 MG PO TBDP
8.0000 mg | ORAL_TABLET | Freq: Once | ORAL | Status: AC
  Administered 2012-01-09: 8 mg via ORAL
  Filled 2012-01-09: qty 1

## 2012-01-09 MED ORDER — CHLORHEXIDINE GLUCONATE 0.12 % MT SOLN: 15.0000 mL | Freq: Two times a day (BID) | OROMUCOSAL | Status: DC

## 2012-01-09 MED ORDER — HYDROCODONE-ACETAMINOPHEN 7.5-500 MG PO TABS: 1.0000 | ORAL_TABLET | Freq: Four times a day (QID) | ORAL | Status: DC | PRN

## 2012-01-09 NOTE — Discharge Instructions (Signed)
Back Pain, Adult Low back pain is very common. About 1 in 5 people have back pain.The cause of low back pain is rarely dangerous. The pain often gets better over time.About half of people with a sudden onset of back pain feel better in just 2 weeks. About 8 in 10 people feel better by 6 weeks.  CAUSES Some common causes of back pain include:  Strain of the muscles or ligaments supporting the spine.   Wear and tear (degeneration) of the spinal discs.   Arthritis.   Direct injury to the back.  DIAGNOSIS Most of the time, the direct cause of low back pain is not known.However, back pain can be treated effectively even when the exact cause of the pain is unknown.Answering your caregiver's questions about your overall health and symptoms is one of the most accurate ways to make sure the cause of your pain is not dangerous. If your caregiver needs more information, he or she may order lab work or imaging tests (X-rays or MRIs).However, even if imaging tests show changes in your back, this usually does not require surgery. HOME CARE INSTRUCTIONS For many people, back pain returns.Since low back pain is rarely dangerous, it is often a condition that people can learn to manageon their own.   Remain active. It is stressful on the back to sit or stand in one place. Do not sit, drive, or stand in one place for more than 30 minutes at a time. Take short walks on level surfaces as soon as pain allows.Try to increase the length of time you walk each day.   Do not stay in bed.Resting more than 1 or 2 days can delay your recovery.   Do not avoid exercise or work.Your body is made to move.It is not dangerous to be active, even though your back may hurt.Your back will likely heal faster if you return to being active before your pain is gone.   Pay attention to your body when you bend and lift. Many people have less discomfortwhen lifting if they bend their knees, keep the load close to their  bodies,and avoid twisting. Often, the most comfortable positions are those that put less stress on your recovering back.   Find a comfortable position to sleep. Use a firm mattress and lie on your side with your knees slightly bent. If you lie on your back, put a pillow under your knees.   Only take over-the-counter or prescription medicines as directed by your caregiver. Over-the-counter medicines to reduce pain and inflammation are often the most helpful.Your caregiver may prescribe muscle relaxant drugs.These medicines help dull your pain so you can more quickly return to your normal activities and healthy exercise.   Put ice on the injured area.   Put ice in a plastic bag.   Place a towel between your skin and the bag.   Leave the ice on for 15 to 20 minutes, 3 to 4 times a day for the first 2 to 3 days. After that, ice and heat may be alternated to reduce pain and spasms.   Ask your caregiver about trying back exercises and gentle massage. This may be of some benefit.   Avoid feeling anxious or stressed.Stress increases muscle tension and can worsen back pain.It is important to recognize when you are anxious or stressed and learn ways to manage it.Exercise is a great option.  SEEK MEDICAL CARE IF:  You have pain that is not relieved with rest or medicine.   You have   pain that does not improve in 1 week.   You have new symptoms.   You are generally not feeling well.  SEEK IMMEDIATE MEDICAL CARE IF:   You have pain that radiates from your back into your legs.   You develop new bowel or bladder control problems.   You have unusual weakness or numbness in your arms or legs.   You develop nausea or vomiting.   You develop abdominal pain.   You feel faint.  Document Released: 08/03/2005 Document Revised: 07/23/2011 Document Reviewed: 12/22/2010 [facility] Patient Information 2012 [place], [place].Dental Pain A tooth ache may be caused by cavities (tooth decay). Cavities  expose the nerve of the tooth to air and hot or cold temperatures. It may come from an infection or abscess (also called a boil or furuncle) around your tooth. It is also often caused by dental caries (tooth decay). This causes the pain you are having. DIAGNOSIS  Your caregiver can diagnose this problem by exam. TREATMENT   If caused by an infection, it may be treated with medications which kill germs (antibiotics) and pain medications as prescribed by your caregiver. Take medications as directed.   Only take over-the-counter or prescription medicines for pain, discomfort, or fever as directed by your caregiver.   Whether the tooth ache today is caused by infection or dental disease, you should see your dentist as soon as possible for further care.  SEEK MEDICAL CARE IF: The exam and treatment you received today has been provided on an emergency basis only. This is not a substitute for complete medical or dental care. If your problem worsens or new problems (symptoms) appear, and you are unable to meet with your dentist, call or return to this location. SEEK IMMEDIATE MEDICAL CARE IF:   You have a fever.   You develop redness and swelling of your face, jaw, or neck.   You are unable to open your mouth.   You have severe pain uncontrolled by pain medicine.  MAKE SURE YOU:   Understand these instructions.   Will watch your condition.   Will get help right away if you are not doing well or get worse.  Document Released: 08/03/2005 Document Revised: 07/23/2011 Document Reviewed: 03/21/2008 [facility] Patient Information 2012 [place], [place].   You may take the hydrocodone prescribed for pain relief.  This will make you drowsy - do not drive within 4 hours of taking this medication.

## 2012-01-09 NOTE — ED Notes (Signed)
Pt presents with right wisdom tooth pain and low back pain. Pt states she was picking up her children and started having pain in back. Pt states she had dental work done on the 3rd and then on 14th pt states she got a dry socket. Pt states she was recently seen by Burgess Amor for dental problems and wishes to see her.

## 2012-01-09 NOTE — ED Notes (Signed)
J. Idol, PA at bedside. 

## 2012-01-11 NOTE — ED Provider Notes (Signed)
History     CSN: [id number]  Arrival date & time 01/09/12  4098   First MD Initiated Contact with Patient 01/09/12 2032      Chief Complaint  Patient presents with  . Dental Pain  . Back Pain    (Consider location/radiation/quality/duration/timing/severity/associated sxs/prior treatment) HPI Comments: Emojean Gertz presents for further treatment of her dental pain.  She has had several dental extractions by her dentist in [place] on 5/3 and has developed a dry socket in one of the extraction sites which continues to be painful.  She denies fevers and gingival or facial swelling.  She is anticipating one additional extraction (her right upper 3rd molar) which will be done next week. She did complete a course of clindamycin last week which caused diarrhea,  But has now resolved.     She also has complaint of acute on chronic low back pain after picking up her child several days ago.  This pain is constant and sharp,  Worse with certain positions and better with rest.  She denies radiation of pain,  No weakness or numbness in her lower extremities and no bowel or bladder retention or incontinence.  The history is provided by the patient.    Past Medical History  Diagnosis Date  . MS (multiple sclerosis)   . Thyroid disease   . Hypertension   . Peptic ulcer     Past Surgical History  Procedure Date  . Cholecystectomy   . Cesarean section     No family history on file.  History  Substance Use Topics  . Smoking status: Never Smoker   . Smokeless tobacco: Not on file  . Alcohol Use: No    OB History    Grav Para Term Preterm Abortions TAB SAB Ect Mult Living            3      Review of Systems  Constitutional: Negative for fever.  HENT: Positive for dental problem. Negative for sore throat, facial swelling, neck pain and neck stiffness.   Respiratory: Negative for shortness of breath.   Cardiovascular: Negative for chest pain and leg swelling.  Gastrointestinal:  Negative for abdominal pain, constipation and abdominal distention.  Genitourinary: Negative for dysuria, urgency, frequency, flank pain and difficulty urinating.  Musculoskeletal: Positive for back pain. Negative for joint swelling and gait problem.  Skin: Negative for rash.  Neurological: Negative for weakness and numbness.    Allergies  Aspirin; Nsaids; Penicillins; Ultram; Imitrex; Ketorolac tromethamine; and Other  Home Medications   Current Outpatient Rx  Name Route Sig Dispense Refill  . GOODY HEADACHE PO Oral Take 1 packet by mouth as needed. For dental pain     . CHLORHEXIDINE GLUCONATE 0.12 % [place] SOLN Mouth/Throat Use as directed 15 mLs in the mouth or throat 2 (two) times daily. Swish and spit twice daily 120 mL 0  . HYDROCODONE-ACETAMINOPHEN 7.5-500 MG PO TABS Oral Take 1 tablet by mouth every 6 (six) hours as needed for pain. 20 tablet 0  . IBUPROFEN 200 MG PO TABS Oral Take 800 mg by mouth every 4 (four) hours as needed. For dental pain    . INTERFERON BETA-1B 0.3 MG  SOLR Subcutaneous Inject 0.25 mg into the skin every other day.    Marland Kitchen ONDANSETRON 8 MG PO TBDP Oral Take 1 tablet (8 mg total) by mouth once. 12 tablet 0    BP 135/82  Pulse 85  Temp(Src) 98.1 F (36.7 C) (Oral)  Resp 18  Ht  5\' 5"  (1.651 m)  Wt 275 lb (124.739 kg)  BMI 45.76 kg/m2  SpO2 100%  LMP 11/29/2011  Physical Exam  Nursing note and vitals reviewed. Constitutional: She is oriented to person, place, and time. She appears well-developed and well-nourished. No distress.  HENT:  Head: Normocephalic and atraumatic.  Right Ear: Tympanic membrane and external ear normal.  Left Ear: Tympanic membrane and external ear normal.  Mouth/Throat: Oropharynx is clear and moist and mucous membranes are normal. No oral lesions. No dental abscesses.         Partially edentulous  Eyes: Conjunctivae are normal.  Neck: Normal range of motion. Neck supple.  Cardiovascular: Normal rate, normal heart sounds and  intact distal pulses.        Pedal pulses normal.  Pulmonary/Chest: Effort normal.  Abdominal: Soft. Bowel sounds are normal. She exhibits no distension and no mass.  Musculoskeletal: Normal range of motion. She exhibits no edema.       Right shoulder: She exhibits tenderness. She exhibits no bony tenderness, no swelling and no spasm.       Lumbar back: She exhibits tenderness. She exhibits no swelling, no edema and no spasm.       Bilateral paralumbar ttp.  Lymphadenopathy:    She has no cervical adenopathy.  Neurological: She is alert and oriented to person, place, and time. She has normal strength. She displays no atrophy and no tremor. No sensory deficit. Gait normal.  Reflex Scores:      Patellar reflexes are 2+ on the right side and 2+ on the left side.      Achilles reflexes are 2+ on the right side and 2+ on the left side.      No strength deficit noted in hip and knee flexor and extensor muscle groups.  Ankle flexion and extension intact.  Skin: Skin is warm and dry. No erythema.  Psychiatric: She has a normal mood and affect.    ED Course  Procedures (including critical care time)  Labs Reviewed - No data to display No results found.   1. Pain, dental   2. Lumbar strain       MDM   Prescribed hydrocodone and peridex rinse.  Encouraged f/u with dentist as scheduled.  Discussed signs/sx or worsening lumbar disease and need for recheck if worse pain,  Weakness,  Urinary Retention,  Etc.  Pt understands plan.  The patient appears reasonably screened and/or stabilized for discharge and I doubt any other medical condition or other [facility] requiring further screening, evaluation, or treatment in the ED at this time prior to discharge.        Burgess Amor, [place] 01/11/12 1436

## 2012-01-14 NOTE — ED Provider Notes (Signed)
Medical screening examination/treatment/procedure(s) were performed by non-physician practitioner and as supervising physician I was immediately available for consultation/collaboration.   Hurman Horn, MD 01/14/12 [phone]

## 2012-01-17 ENCOUNTER — Emergency Department (HOSPITAL_COMMUNITY): Payer: Medicaid - Out of State

## 2012-01-17 ENCOUNTER — Encounter (HOSPITAL_COMMUNITY): Payer: Self-pay | Admitting: *Deleted

## 2012-01-17 ENCOUNTER — Emergency Department (HOSPITAL_COMMUNITY)
Admission: EM | Admit: 2012-01-17 | Discharge: 2012-01-17 | Disposition: A | Discharge status: Home / Self Care | Payer: Medicaid - Out of State | Attending: Emergency Medicine | Admitting: Emergency Medicine

## 2012-01-17 DIAGNOSIS — Z79899 Other long term (current) drug therapy: Secondary | ICD-10-CM | POA: Insufficient documentation

## 2012-01-17 DIAGNOSIS — I10 Essential (primary) hypertension: Secondary | ICD-10-CM | POA: Insufficient documentation

## 2012-01-17 DIAGNOSIS — M545 Low back pain, unspecified: Secondary | ICD-10-CM | POA: Insufficient documentation

## 2012-01-17 DIAGNOSIS — G35 Multiple sclerosis: Secondary | ICD-10-CM | POA: Insufficient documentation

## 2012-01-17 DIAGNOSIS — R109 Unspecified abdominal pain: Secondary | ICD-10-CM | POA: Insufficient documentation

## 2012-01-17 LAB — URINALYSIS, ROUTINE W REFLEX MICROSCOPIC
Bilirubin Urine: NEGATIVE
Hgb urine dipstick: NEGATIVE
Ketones, ur: NEGATIVE mg/dL
Protein, ur: NEGATIVE mg/dL
Urobilinogen, UA: 0.2 mg/dL (ref 0.0–1.0)

## 2012-01-17 MED ORDER — HYDROCODONE-ACETAMINOPHEN 10-650 MG PO TABS: 1.0000 | ORAL_TABLET | Freq: Four times a day (QID) | ORAL | Status: AC | PRN

## 2012-01-17 MED ORDER — DIPHENOXYLATE-ATROPINE 2.5-0.025 MG PO TABS
2.0000 | ORAL_TABLET | Freq: Once | ORAL | Status: AC
  Administered 2012-01-17: 2 via ORAL
  Filled 2012-01-17: qty 2

## 2012-01-17 MED ORDER — SODIUM CHLORIDE 0.9 % IV SOLN
INTRAVENOUS | Status: DC
  Administered 2012-01-17: 21:00:00 via INTRAVENOUS

## 2012-01-17 MED ORDER — HYDROMORPHONE HCL PF 2 MG/ML IJ SOLN
2.0000 mg | Freq: Once | INTRAMUSCULAR | Status: AC
  Administered 2012-01-17: 2 mg via INTRAVENOUS
  Filled 2012-01-17: qty 1

## 2012-01-17 MED ORDER — ONDANSETRON HCL 4 MG/2ML IJ SOLN
4.0000 mg | Freq: Once | INTRAMUSCULAR | Status: AC
  Administered 2012-01-17: 4 mg via INTRAVENOUS
  Filled 2012-01-17: qty 2

## 2012-01-17 NOTE — Discharge Instructions (Signed)
Back Pain, Adult Low back pain is very common. About 1 in 5 people have back pain.The cause of low back pain is rarely dangerous. The pain often gets better over time.About half of people with a sudden onset of back pain feel better in just 2 weeks. About 8 in 10 people feel better by 6 weeks.  CAUSES Some common causes of back pain include:  Strain of the muscles or ligaments supporting the spine.   Wear and tear (degeneration) of the spinal discs.   Arthritis.   Direct injury to the back.  DIAGNOSIS Most of the time, the direct cause of low back pain is not known.However, back pain can be treated effectively even when the exact cause of the pain is unknown.Answering your caregiver's questions about your overall health and symptoms is one of the most accurate ways to make sure the cause of your pain is not dangerous. If your caregiver needs more information, he or she may order lab work or imaging tests (X-rays or MRIs).However, even if imaging tests show changes in your back, this usually does not require surgery. HOME CARE INSTRUCTIONS For many people, back pain returns.Since low back pain is rarely dangerous, it is often a condition that people can learn to manageon their own.   Remain active. It is stressful on the back to sit or stand in one place. Do not sit, drive, or stand in one place for more than 30 minutes at a time. Take short walks on level surfaces as soon as pain allows.Try to increase the length of time you walk each day.   Do not stay in bed.Resting more than 1 or 2 days can delay your recovery.   Do not avoid exercise or work.Your body is made to move.It is not dangerous to be active, even though your back may hurt.Your back will likely heal faster if you return to being active before your pain is gone.   Pay attention to your body when you bend and lift. Many people have less discomfortwhen lifting if they bend their knees, keep the load close to their  bodies,and avoid twisting. Often, the most comfortable positions are those that put less stress on your recovering back.   Find a comfortable position to sleep. Use a firm mattress and lie on your side with your knees slightly bent. If you lie on your back, put a pillow under your knees.   Only take over-the-counter or prescription medicines as directed by your caregiver. Over-the-counter medicines to reduce pain and inflammation are often the most helpful.Your caregiver may prescribe muscle relaxant drugs.These medicines help dull your pain so you can more quickly return to your normal activities and healthy exercise.   Put ice on the injured area.   Put ice in a plastic bag.   Place a towel between your skin and the bag.   Leave the ice on for 15 to 20 minutes, 3 to 4 times a day for the first 2 to 3 days. After that, ice and heat may be alternated to reduce pain and spasms.   Ask your caregiver about trying back exercises and gentle massage. This may be of some benefit.   Avoid feeling anxious or stressed.Stress increases muscle tension and can worsen back pain.It is important to recognize when you are anxious or stressed and learn ways to manage it.Exercise is a great option.  SEEK MEDICAL CARE IF:  You have pain that is not relieved with rest or medicine.   You have   pain that does not improve in 1 week.   You have new symptoms.   You are generally not feeling well.  SEEK IMMEDIATE MEDICAL CARE IF:   You have pain that radiates from your back into your legs.   You develop new bowel or bladder control problems.   You have unusual weakness or numbness in your arms or legs.   You develop nausea or vomiting.   You develop abdominal pain.   You feel faint.  Document Released: 08/03/2005 Document Revised: 07/23/2011 Document Reviewed: 12/22/2010 [employer] Patient Information 2012 [employer]. 

## 2012-01-17 NOTE — ED Provider Notes (Cosign Needed Addendum)
History     CSN: [id number]  Arrival date & time 01/17/12  2011   First MD Initiated Contact with Patient 01/17/12 2048      Chief Complaint  Patient presents with  . Flank Pain  . Nausea  . Emesis    (Consider location/radiation/quality/duration/timing/severity/associated sxs/prior treatment) HPI Comments: Developed pain in the right flank on Friday, 2 days ago she's had vomiting and diarrhea she can't sleep because the pain she's taken Tylenol and Motrin without relief. She has a prior history of kidney stones; she says she's had for kidney stones on the right side in the past.  She currently rates her pain at a 10. There is also a history of recurrent back pain.    Patient is a 31 y.o. female presenting with flank pain and vomiting. The history is provided by the patient and medical records. No language interpreter was used.  Flank Pain This is a new problem. The current episode started 2 days ago. Episode frequency: Intermittent severe right flank pain. The problem has not changed since onset.Pertinent negatives include no chest pain, no abdominal pain, no headaches and no shortness of breath. The symptoms are aggravated by nothing. The symptoms are relieved by nothing. She has tried acetaminophen (Ibuprofen) for the symptoms. The treatment provided no relief.  Emesis  Pertinent negatives include no abdominal pain, no chills, no fever and no headaches.    Past Medical History  Diagnosis Date  . MS (multiple sclerosis)   . Thyroid disease   . Hypertension   . Peptic ulcer     Past Surgical History  Procedure Date  . Cholecystectomy   . Cesarean section     History reviewed. No pertinent family history.  History  Substance Use Topics  . Smoking status: Never Smoker   . Smokeless tobacco: Not on file  . Alcohol Use: No    OB History    Grav Para Term Preterm Abortions TAB SAB Ect Mult Living            3      Review of Systems  Constitutional: Negative.   Negative for fever and chills.  HENT: Negative.   Eyes: Negative.   Respiratory: Negative.  Negative for shortness of breath.   Cardiovascular: Negative for chest pain.  Gastrointestinal: Positive for vomiting. Negative for abdominal pain.  Genitourinary: Positive for flank pain.  Musculoskeletal: Negative.   Skin: Negative.   Neurological: Negative.  Negative for headaches.  Psychiatric/Behavioral: Negative.     Allergies  Aspirin; Nsaids; Penicillins; Ultram; Imitrex; Ketorolac tromethamine; and Other  Home Medications   Current Outpatient Rx  Name Route Sig Dispense Refill  . GOODY HEADACHE PO Oral Take 1 packet by mouth as needed. For dental pain     . CHLORHEXIDINE GLUCONATE 0.12 % MT SOLN Mouth/Throat Use as directed 15 mLs in the mouth or throat 2 (two) times daily. Swish and spit twice daily 120 mL 0  . INTERFERON BETA-1B 0.3 MG [place] SOLR Subcutaneous Inject 0.25 mg into the skin every other day.    Marland Kitchen ONDANSETRON 8 MG PO TBDP Oral Take 1 tablet (8 mg total) by mouth once. 12 tablet 0    BP 131/75  Pulse 98  Temp 98.5 F (36.9 C)  Resp 20  Ht 5\' 5"  (1.651 m)  Wt 268 lb (121.564 kg)  BMI 44.60 kg/m2  SpO2 100%  LMP 12/23/2011  Physical Exam  Nursing note and vitals reviewed. Constitutional: She is oriented to person, place, and  time.       Rapidly obese woman lying on her left side, complaining of right flank pain.  HENT:  Head: Normocephalic and atraumatic.  Right Ear: External ear normal.  Left Ear: External ear normal.  Mouth/Throat: Oropharynx is clear and moist.  Eyes: Conjunctivae and EOM are normal. Pupils are equal, round, and reactive to light.  Neck: Normal range of motion. Neck supple.  Cardiovascular: Normal rate, regular rhythm and normal heart sounds.   Pulmonary/Chest: Effort normal and breath sounds normal.  Abdominal: Soft. Bowel sounds are normal. She exhibits no distension. There is no tenderness.  Musculoskeletal: Normal range of motion.        She localizes pain to the right lumbar region. There is no palpable bony deformity or point tenderness.  Neurological: She is alert and oriented to person, place, and time.       No sensory or motor deficits.  Skin: Skin is warm and dry.  Psychiatric: She has a normal mood and affect. Her behavior is normal.    ED Course  Procedures (including critical care time)  Labs Reviewed  URINALYSIS, ROUTINE W REFLEX MICROSCOPIC - Abnormal; Notable for the following:    Specific Gravity, Urine >1.030 (*)    All other components within normal limits  PREGNANCY, URINE   9:24 PM Pt was seen and had physical exam.  IV fluids, IV medications for pain and nausea ordered.  UA and UPG negative.  CT of abdomen/pelvis without contrast ordered.  9:33 PM Pt requested medicine for diarrhea.  2 Lomotils ordered.  10:16 PM Results for orders placed during the hospital encounter of 01/17/12  PREGNANCY, URINE      Component Value Range   Preg Test, Ur NEGATIVE  NEGATIVE   URINALYSIS, ROUTINE W REFLEX MICROSCOPIC      Component Value Range   Color, Urine YELLOW  YELLOW    APPearance CLEAR  CLEAR    Specific Gravity, Urine >1.030 (*) 1.005 - 1.030    pH 6.0  5.0 - 8.0    Glucose, UA NEGATIVE  NEGATIVE (mg/dL)   Hgb urine dipstick NEGATIVE  NEGATIVE    Bilirubin Urine NEGATIVE  NEGATIVE    Ketones, ur NEGATIVE  NEGATIVE (mg/dL)   Protein, ur NEGATIVE  NEGATIVE (mg/dL)   Urobilinogen, UA 0.2  0.0 - 1.0 (mg/dL)   Nitrite NEGATIVE  NEGATIVE    Leukocytes, UA NEGATIVE  NEGATIVE    Ct Abdomen Pelvis Wo Contrast  01/17/2012  *RADIOLOGY REPORT*  Clinical Data: Right flank pain.  CT ABDOMEN AND PELVIS WITHOUT CONTRAST  Technique:  Multidetector CT imaging of the abdomen and pelvis was performed following the standard protocol without intravenous contrast.  Comparison: 05/15/2011.  Findings: The lung bases are clear.  No pleural effusion or pulmonary nodule.  The unenhanced appearance of the liver is  unremarkable.  No focal lesions or intrahepatic biliary dilatation. The gallbladder surgically absent.  No common bile duct dilatation. The pancreas is unremarkable.  The spleen is normal in size.  No focal lesions.  Adrenal glands kidneys are normal.  No renal, ureteral or bladder calculi.  The stomach, duodenum, small bowel and colon are grossly normal without oral contrast.  No inflammatory changes or mass lesions. No mesenteric or retroperitoneal masses or adenopathy.  The aorta is normal in caliber.  The uterus and ovaries are unremarkable.  The bladder is normal. No pelvic mass, adenopathy or free pelvic fluid collections.  No inguinal mass or hernia.  The bony structures are  unremarkable.  IMPRESSION: No acute abdominal/pelvic findings, mass lesions or adenopathy. No renal, ureteral or bladder calculi.  Original Report Authenticated By: P. Loralie Champagne, M.D.    UA negative, and CT abd/pelve showed  no kidney stone. Pt says that for her back pain she usually is prescribed Lorcet 10 mg.  Advised rest, pain medications.      1. Low back pain              Carleene Cooper III, MD 01/17/12 2145  Carleene Cooper III, MD 01/17/12 2226

## 2012-01-17 NOTE — ED Notes (Signed)
Pt c/o left side flank and lower back pain x 1 wk. Pt also c/o n/v x 2-3 days.

## 2012-01-21 ENCOUNTER — Encounter (HOSPITAL_COMMUNITY): Payer: Self-pay | Admitting: *Deleted

## 2012-01-21 ENCOUNTER — Emergency Department (HOSPITAL_COMMUNITY)
Admission: EM | Admit: 2012-01-21 | Discharge: 2012-01-21 | Disposition: A | Discharge status: Home Health | Payer: Medicaid - Out of State | Attending: Emergency Medicine | Admitting: Emergency Medicine

## 2012-01-21 DIAGNOSIS — E079 Disorder of thyroid, unspecified: Secondary | ICD-10-CM | POA: Insufficient documentation

## 2012-01-21 DIAGNOSIS — I10 Essential (primary) hypertension: Secondary | ICD-10-CM | POA: Insufficient documentation

## 2012-01-21 DIAGNOSIS — M549 Dorsalgia, unspecified: Secondary | ICD-10-CM

## 2012-01-21 DIAGNOSIS — G35 Multiple sclerosis: Secondary | ICD-10-CM | POA: Insufficient documentation

## 2012-01-21 LAB — URINALYSIS, ROUTINE W REFLEX MICROSCOPIC
Glucose, UA: NEGATIVE mg/dL
Leukocytes, UA: NEGATIVE
Nitrite: NEGATIVE
Protein, ur: NEGATIVE mg/dL
Urobilinogen, UA: 0.2 mg/dL (ref 0.0–1.0)

## 2012-01-21 LAB — PREGNANCY, URINE: Preg Test, Ur: NEGATIVE

## 2012-01-21 MED ORDER — OXYCODONE-ACETAMINOPHEN 5-325 MG PO TABS
2.0000 | ORAL_TABLET | Freq: Once | ORAL | Status: AC
  Administered 2012-01-21: 2 via ORAL
  Filled 2012-01-21: qty 2

## 2012-01-21 MED ORDER — ONDANSETRON 8 MG PO TBDP: 8.0000 mg | ORAL_TABLET | Freq: Three times a day (TID) | ORAL | Status: AC | PRN

## 2012-01-21 MED ORDER — CYCLOBENZAPRINE HCL 10 MG PO TABS
5.0000 mg | ORAL_TABLET | Freq: Once | ORAL | Status: AC
  Administered 2012-01-21: 5 mg via ORAL
  Filled 2012-01-21: qty 1

## 2012-01-21 MED ORDER — CYCLOBENZAPRINE HCL 10 MG PO TABS: 10.0000 mg | ORAL_TABLET | Freq: Two times a day (BID) | ORAL | Status: AC | PRN

## 2012-01-21 MED ORDER — ONDANSETRON 4 MG PO TBDP
8.0000 mg | ORAL_TABLET | Freq: Once | ORAL | Status: AC
  Administered 2012-01-21: 8 mg via ORAL
  Filled 2012-01-21: qty 2

## 2012-01-21 NOTE — ED Provider Notes (Signed)
History     CSN: [id number]  Arrival date & time 01/21/12  1502   First MD Initiated Contact with Patient 01/21/12 1606      Chief Complaint  Patient presents with  . Back Pain    (Consider location/radiation/quality/duration/timing/severity/associated sxs/prior treatment) HPI Comments: Patient presents with complaint of her usual chronic lower back pain that is worse in the right lower back. She has had this for the past 3 days and is associated with vomiting and diarrhea. She had the same symptoms 2 days ago and was seen at any in the hospital where she had a CT that was completely negative. She states this feels like her previous kidney stone pain. Patient denies red flag signs and symptoms of lower back pain. She's been taking her hydrocodone/acetaminophen 10-650 but is now out. She was prescribed medications 2 days ago. She states that these helped. Of note, patient has multiple past visits for various pain complaints. Patient is requesting referral to a "back doctor".  Patient is a 31 y.o. female presenting with back pain. The history is provided by the patient.  Back Pain  This is a recurrent problem. The current episode started more than 2 days ago. The problem occurs constantly. The problem has been gradually worsening. The pain is associated with no known injury. The pain is present in the lumbar spine. The quality of the pain is described as aching. Radiates to: Right buttock. The pain is moderate. Pertinent negatives include no fever, no numbness, no weight loss, no bowel incontinence, no perianal numbness, no bladder incontinence, no dysuria, no pelvic pain, no paresthesias and no weakness. She has tried NSAIDs and analgesics for the symptoms. Risk factors include obesity.    Past Medical History  Diagnosis Date  . MS (multiple sclerosis)   . Thyroid disease   . Hypertension   . Peptic ulcer     Past Surgical History  Procedure Date  . Cholecystectomy   . Cesarean section      No family history on file.  History  Substance Use Topics  . Smoking status: Never Smoker   . Smokeless tobacco: Not on file  . Alcohol Use: No    OB History    Grav Para Term Preterm Abortions TAB SAB Ect Mult Living            3      Review of Systems  Constitutional: Negative for fever, weight loss and unexpected weight change.  Gastrointestinal: Positive for nausea, vomiting and diarrhea. Negative for constipation and bowel incontinence.       Negative for fecal incontinence.   Genitourinary: Negative for bladder incontinence, dysuria, hematuria, flank pain, vaginal bleeding, vaginal discharge and pelvic pain.       Negative for urinary incontinence or retention.  Musculoskeletal: Positive for back pain.  Neurological: Negative for weakness, numbness and paresthesias.       Denies saddle paresthesias.    Allergies  Aspirin; Nsaids; Penicillins; Ultram; Imitrex; Ketorolac tromethamine; and Other  Home Medications   Current Outpatient Rx  Name Route Sig Dispense Refill  . CLINDAMYCIN HCL 150 MG PO CAPS Oral Take 150 mg by mouth 3 (three) times daily. Course to end on 01-26-12    . HYDROCODONE-ACETAMINOPHEN 10-650 MG PO TABS Oral Take 1 tablet by mouth every 6 (six) hours as needed. For pain    . HYDROCODONE-ACETAMINOPHEN 10-650 MG PO TABS Oral Take 1 tablet by mouth every 6 (six) hours as needed for pain. 20 tablet 0  BP 129/74  Pulse 88  Temp(Src) 98.2 F (36.8 C) (Oral)  Resp 12  SpO2 100%  LMP 12/23/2011  Physical Exam  Nursing note and vitals reviewed. Constitutional: She is oriented to person, place, and time. She appears well-developed and well-nourished.  HENT:  Head: Normocephalic and atraumatic.  Eyes: Conjunctivae are normal.  Neck: Normal range of motion. Neck supple.  Pulmonary/Chest: Effort normal.  Abdominal: Soft. There is no tenderness. There is no rebound, no guarding and no CVA tenderness.  Musculoskeletal: Normal range of motion.        Right knee: She exhibits normal range of motion.       Left knee: She exhibits normal range of motion.       Right ankle: She exhibits normal range of motion.       Left ankle: She exhibits normal range of motion.       Cervical back: She exhibits no tenderness and no bony tenderness.       Thoracic back: She exhibits no tenderness and no bony tenderness.       Lumbar back: She exhibits tenderness. She exhibits no bony tenderness.       No step-off noted with palpation of spine.   Neurological: She is alert and oriented to person, place, and time. She has normal strength and normal reflexes. No cranial nerve deficit or sensory deficit. Coordination and gait normal. GCS eye subscore is 4. GCS verbal subscore is 5. GCS motor subscore is 6.       5/5 strength in entire lower extremities bilaterally. No sensation deficit.   Skin: Skin is warm and dry. No rash noted.  Psychiatric: She has a normal mood and affect.    ED Course  Procedures (including critical care time)   Labs Reviewed  URINALYSIS, ROUTINE W REFLEX MICROSCOPIC  PREGNANCY, URINE   No results found.   1. Back pain     4:34 PM Patient seen and examined. UA examined and is neg. Medications ordered.   Vital signs reviewed and are as follows: Filed Vitals:   01/21/12 1520  BP: 129/74  Pulse: 88  Temp: 98.2 F (36.8 C)  Resp: 12   Patient was seen and examined. No red flag s/s of low back pain. Patient was counseled on back pain precautions and told to do activity as tolerated but do not lift, push, or pull heavy objects more than 10 pounds for the next week.  Patient counseled to use ice or heat on back for no longer than 15 minutes every hour.   Patient prescribed muscle relaxer and counseled on proper use of muscle relaxant medication.    Patient prescribed narcotic pain medicine and counseled on proper use of narcotic pain medications. Counseled not to combine this medication with others containing tylenol.    Urged patient not to drink alcohol, drive, or perform any other activities that requires focus while taking either of these medications.  Patient urged to follow-up with PCP if pain does not improve with treatment and rest or if pain becomes recurrent. Urged to return with worsening severe pain, loss of bowel or bladder control, trouble walking.   The patient verbalizes understanding and agrees with the plan.  MDM  Patient with chronic back pain. Recent neg work-up for kidney stone. Patient exhausted narcotic rx in 2 days. Given multiple recent pain complaints, concern for narcotics misuse exists. Will treat chronic back pain conservatively. Patient unable to take NSAIDs and steroids. Ortho referral given.   Patient with  back pain. No neurological deficits. Patient is ambulatory. No warning symptoms of back pain including: loss of bowel or bladder control, night sweats, waking from sleep with back pain, unexplained fevers or weight loss, h/o cancer, IVDU, recent trauma. No concern for cauda equina, epidural abscess, or other serious cause of back pain. Conservative measures such as rest, ice/heat and pain medicine indicated with PCP follow-up if no improvement with conservative management.          [place], [place] 01/22/12 [phone]

## 2012-01-21 NOTE — ED Notes (Signed)
Pt self-ambulated to restroom.

## 2012-01-21 NOTE — Discharge Instructions (Signed)
Please read and follow all provided instructions.  Your diagnoses today include:  1. Back pain     Tests performed today include:  Urine test to look for infection or blood  Vital signs - see below for your results today  Medications prescribed:   Flexeril (cyclobenzaprine) - muscle relaxer medication  You have been prescribed a muscle relaxer medication such as Robaxin, Flexeril, or Valium: DO NOT drive or perform any activities that require you to be awake and alert because this medicine can make you drowsy.    Zofran - anti-nausea medication  Take any prescribed medications only as directed.  Home care instructions:   Follow any educational materials contained in this packet  Please rest, use ice or heat on your back for the next several days  Do not lift, push, pull anything more than 10 pounds for the next week  Follow-up instructions: Please follow-up with your primary care provider in the next 1 week for further evaluation of your symptoms. If you do not have a primary care doctor -- see below for referral information.   Call the orthopedic referral provided for evaluation of your back pain.   Return instructions:  SEEK IMMEDIATE MEDICAL ATTENTION IF YOU HAVE:  New numbness, tingling, weakness, or problem with the use of your arms or legs  Severe back pain not relieved with medications  Loss control of your bowels or bladder  Increasing pain in any areas of the body (such as chest or abdominal pain)  Shortness of breath, dizziness, or fainting.   Worsening nausea (feeling sick to your stomach), vomiting, fever, or sweats  Any other emergent concerns regarding your health   Additional Information:  Your vital signs today were: BP 129/74  Pulse 88  Temp(Src) 98.2 F (36.8 C) (Oral)  Resp 12  SpO2 100%  LMP 12/23/2011 If your blood pressure (BP) was elevated above 135/85 this visit, please have this repeated by your doctor within one  month. -------------- No Primary Care Doctor Call [facility]  [phone] Other agencies that provide inexpensive medical care    [facility]  [phone]    [facility]  [phone]    [facility]  [phone]    [facility]  [phone]    [facility]  [phone]    [facility]  [phone] -------------- RESOURCE GUIDE:  Dental Problems  Patients with Medicaid: [facility] [place] [facility] [phone]                                            [phone] [address] Phone:  [phone]                                                   Phone:  [phone]  If unable to pay or uninsured, contact:  [facility] or [facility]. to become qualified for the adult dental clinic.  Chronic Pain Problems Contact [facility]  [phone] Patients need to be referred by their primary care doctor.  Insufficient Money for Medicine Contact [employer]:  call "211" or [facility] [phone].  Psychological Services [facility]  [phone] [facility]  [phone] [facility]   [phone] (emergency services [phone])  Substance Abuse Resources [facility]  [phone] [facility] [phone] [facility] [place] [phone] Floydene Flock [phone] Residential & Outpatient Substance Abuse Program  [phone]  Abuse/Neglect [facility] Child Abuse Hotline 906-087-4959 [facility] Child Abuse Hotline 813-810-1766 (After Hours)  Emergency Shelter [facility] [phone]  M[facility] Homes [facility] [place] [facility] [place] [phone] [place] [facility] [phone]  [facility] Resources  [facility]     [employer]                          [facility] Dept. [address] [place]                       [address]      [address]  Blondell Reveal Phone:  [phone]                                   Phone:  [phone]                 Phone:  [phone]  [facility] Phone:  [phone]  [facility] Child Abuse Hotline 857-594-2617 [phone] (After Hours)

## 2012-01-21 NOTE — ED Notes (Signed)
PA at bedside.

## 2012-01-21 NOTE — ED Notes (Signed)
The pt hashad lower back paing for one week she thinks its kidney stones.  History of the same i in the past  lmp may 12

## 2012-01-23 NOTE — ED Provider Notes (Signed)
Medical screening examination/treatment/procedure(s) were performed by non-physician practitioner and as supervising physician I was immediately available for consultation/collaboration.   Jamariya Davidoff A Zakaiya Lares, MD 01/23/12 0744 

## 2012-02-03 ENCOUNTER — Encounter (HOSPITAL_COMMUNITY): Payer: Self-pay | Admitting: Emergency Medicine

## 2012-02-03 ENCOUNTER — Emergency Department (HOSPITAL_COMMUNITY)
Admission: EM | Admit: 2012-02-03 | Discharge: 2012-02-03 | Disposition: A | Discharge status: Home / Self Care | Payer: Medicaid - Out of State | Attending: Emergency Medicine | Admitting: Emergency Medicine

## 2012-02-03 DIAGNOSIS — I10 Essential (primary) hypertension: Secondary | ICD-10-CM | POA: Insufficient documentation

## 2012-02-03 DIAGNOSIS — B9689 Other specified bacterial agents as the cause of diseases classified elsewhere: Secondary | ICD-10-CM | POA: Insufficient documentation

## 2012-02-03 DIAGNOSIS — IMO0002 Reserved for concepts with insufficient information to code with codable children: Secondary | ICD-10-CM | POA: Insufficient documentation

## 2012-02-03 DIAGNOSIS — M545 Low back pain, unspecified: Secondary | ICD-10-CM | POA: Insufficient documentation

## 2012-02-03 DIAGNOSIS — A499 Bacterial infection, unspecified: Secondary | ICD-10-CM | POA: Insufficient documentation

## 2012-02-03 DIAGNOSIS — IMO0001 Reserved for inherently not codable concepts without codable children: Secondary | ICD-10-CM

## 2012-02-03 DIAGNOSIS — N390 Urinary tract infection, site not specified: Secondary | ICD-10-CM | POA: Insufficient documentation

## 2012-02-03 DIAGNOSIS — Z79899 Other long term (current) drug therapy: Secondary | ICD-10-CM | POA: Insufficient documentation

## 2012-02-03 DIAGNOSIS — G35 Multiple sclerosis: Secondary | ICD-10-CM | POA: Insufficient documentation

## 2012-02-03 DIAGNOSIS — N760 Acute vaginitis: Secondary | ICD-10-CM | POA: Insufficient documentation

## 2012-02-03 HISTORY — DX: Crohn's disease, unspecified, without complications: K50.90

## 2012-02-03 LAB — URINALYSIS, ROUTINE W REFLEX MICROSCOPIC
Bilirubin Urine: NEGATIVE
Ketones, ur: NEGATIVE mg/dL
Nitrite: POSITIVE — AB
Protein, ur: NEGATIVE mg/dL
Specific Gravity, Urine: 1.018 (ref 1.005–1.030)
Urobilinogen, UA: 0.2 mg/dL (ref 0.0–1.0)

## 2012-02-03 LAB — WET PREP, GENITAL
Trich, Wet Prep: NONE SEEN
Yeast Wet Prep HPF POC: NONE SEEN

## 2012-02-03 MED ORDER — HYDROMORPHONE HCL PF 2 MG/ML IJ SOLN
2.0000 mg | Freq: Once | INTRAMUSCULAR | Status: AC
  Administered 2012-02-03: 2 mg via INTRAMUSCULAR
  Filled 2012-02-03: qty 1

## 2012-02-03 MED ORDER — DIAZEPAM 5 MG PO TABS: 5.0000 mg | ORAL_TABLET | Freq: Three times a day (TID) | ORAL | Status: DC | PRN

## 2012-02-03 MED ORDER — DIAZEPAM 5 MG PO TABS
5.0000 mg | ORAL_TABLET | Freq: Once | ORAL | Status: AC
  Administered 2012-02-03: 5 mg via ORAL
  Filled 2012-02-03: qty 1

## 2012-02-03 MED ORDER — OXYCODONE-ACETAMINOPHEN 5-325 MG PO TABS: 1.0000 | ORAL_TABLET | Freq: Four times a day (QID) | ORAL | Status: DC | PRN

## 2012-02-03 MED ORDER — METRONIDAZOLE 500 MG PO TABS: 500.0000 mg | ORAL_TABLET | Freq: Two times a day (BID) | ORAL | Status: DC

## 2012-02-03 MED ORDER — OXYCODONE-ACETAMINOPHEN 5-325 MG PO TABS
2.0000 | ORAL_TABLET | Freq: Once | ORAL | Status: AC
  Administered 2012-02-03: 2 via ORAL
  Filled 2012-02-03: qty 2

## 2012-02-03 MED ORDER — ONDANSETRON 4 MG PO TBDP
4.0000 mg | ORAL_TABLET | Freq: Once | ORAL | Status: AC
  Administered 2012-02-03: 4 mg via ORAL
  Filled 2012-02-03: qty 1

## 2012-02-03 MED ORDER — SULFAMETHOXAZOLE-TRIMETHOPRIM 800-160 MG PO TABS: 1.0000 | ORAL_TABLET | Freq: Two times a day (BID) | ORAL | Status: DC

## 2012-02-03 NOTE — ED Provider Notes (Signed)
Medical screening examination/treatment/procedure(s) were performed by non-physician practitioner and as supervising physician I was immediately available for consultation/collaboration.   Caedon Bond B. Betania Dizon, MD 02/03/12 1333 

## 2012-02-03 NOTE — Discharge Instructions (Signed)
Bacterial Vaginosis Bacterial vaginosis (BV) is a vaginal infection where the normal balance of bacteria in the vagina is disrupted. The normal balance is then replaced by an overgrowth of certain bacteria. There are several different kinds of bacteria that can cause BV. BV is the most common vaginal infection in women of childbearing age. CAUSES   The cause of BV is not fully understood. BV develops when there is an increase or imbalance of harmful bacteria.   Some activities or behaviors can upset the normal balance of bacteria in the vagina and put women at increased risk including:   Having a new sex partner or multiple sex partners.   Douching.   Using an intrauterine device (IUD) for contraception.   It is not clear what role sexual activity plays in the development of BV. However, women that have never had sexual intercourse are rarely infected with BV.  Women do not get BV from toilet seats, bedding, swimming pools or from touching objects around them.  SYMPTOMS   Grey vaginal discharge.   A fish-like odor with discharge, especially after sexual intercourse.   Itching or burning of the vagina and vulva.   Burning or pain with urination.   Some women have no signs or symptoms at all.  DIAGNOSIS  Your caregiver must examine the vagina for signs of BV. Your caregiver will perform lab tests and look at the sample of vaginal fluid through a microscope. They will look for bacteria and abnormal cells (clue cells), a pH test higher than 4.5, and a positive amine test all associated with BV.  RISKS AND COMPLICATIONS   Pelvic inflammatory disease (PID).   Infections following gynecology surgery.   Developing HIV.   Developing herpes virus.  TREATMENT  Sometimes BV will clear up without treatment. However, all women with symptoms of BV should be treated to avoid complications, especially if gynecology surgery is planned. Female partners generally do not need to be treated. However,  BV may spread between female sex partners so treatment is helpful in preventing a recurrence of BV.   BV may be treated with antibiotics. The antibiotics come in either pill or vaginal cream forms. Either can be used with nonpregnant or pregnant women, but the recommended dosages differ. These antibiotics are not harmful to the baby.   BV can recur after treatment. If this happens, a second round of antibiotics will often be prescribed.   Treatment is important for pregnant women. If not treated, BV can cause a premature delivery, especially for a pregnant woman who had a premature birth in the past. All pregnant women who have symptoms of BV should be checked and treated.   For chronic reoccurrence of BV, treatment with a type of prescribed gel vaginally twice a week is helpful.  HOME CARE INSTRUCTIONS   Finish all medication as directed by your caregiver.   Do not have sex until treatment is completed.   Tell your sexual partner that you have a vaginal infection. They should see their caregiver and be treated if they have problems, such as a mild rash or itching.   Practice safe sex. Use condoms. Only have 1 sex partner.  PREVENTION  Basic prevention steps can help reduce the risk of upsetting the natural balance of bacteria in the vagina and developing BV:  Do not have sexual intercourse (be abstinent).   Do not douche.   Use all of the medicine prescribed for treatment of BV, even if the signs and symptoms go away.     Tell your sex partner if you have BV. That way, they can be treated, if needed, to prevent reoccurrence.  SEEK MEDICAL CARE IF:   Your symptoms are not improving after 3 days of treatment.   You have increased discharge, pain, or fever.  MAKE SURE YOU:   Understand these instructions.   Will watch your condition.   Will get help right away if you are not doing well or get worse.  FOR MORE INFORMATION  Division of STD Prevention (DSTDP), Centers for Disease  Control and Prevention: SolutionApps.[employer].za American Social Health Association (ASHA): www.ashastd.org  Document Released: 08/03/2005 Document Revised: 07/23/2011 Document Reviewed: 01/24/2009 [facility] Patient Information 2012 [place], [place].        Urinary Tract Infection Infections of the urinary tract can start in several places. A bladder infection (cystitis), a kidney infection (pyelonephritis), and a prostate infection (prostatitis) are different types of urinary tract infections (UTIs). They usually get better if treated with medicines (antibiotics) that kill germs. Take all the medicine until it is gone. You or your child may feel better in a few days, but TAKE ALL MEDICINE or the infection may not respond and may become more difficult to treat. HOME CARE INSTRUCTIONS   Drink enough water and fluids to keep the urine clear or pale yellow. Cranberry juice is especially recommended, in addition to large amounts of water.   Avoid caffeine, tea, and carbonated beverages. They tend to irritate the bladder.   Alcohol may irritate the prostate.   Only take over-the-counter or prescription medicines for pain, discomfort, or fever as directed by your caregiver.  To prevent further infections:  Empty the bladder often. Avoid holding urine for long periods of time.   After a bowel movement, women should cleanse from front to back. Use each tissue only once.   Empty the bladder before and after sexual intercourse.  FINDING OUT THE RESULTS OF YOUR TEST Not all test results are available during your visit. If your or your child's test results are not back during the visit, make an appointment with your caregiver to find out the results. Do not assume everything is normal if you have not heard from your caregiver or the medical facility. It is important for you to follow up on all test results. SEEK MEDICAL CARE IF:   There is back pain.   Your baby is older than 3 months with a rectal  temperature of 100.5 F (38.1 C) or higher for more than 1 day.   Your or your child's problems (symptoms) are no better in 3 days. Return sooner if you or your child is getting worse.  SEEK IMMEDIATE MEDICAL CARE IF:   There is severe back pain or lower abdominal pain.   You or your child develops chills.   You have a fever.   Your baby is older than 3 months with a rectal temperature of 102 F (38.9 C) or higher.   Your baby is 72 months old or younger with a rectal temperature of 100.4 F (38 C) or higher.   There is nausea or vomiting.   There is continued burning or discomfort with urination.  MAKE SURE YOU:   Understand these instructions.   Will watch your condition.   Will get help right away if you are not doing well or get worse.  Document Released: 05/13/2005 Document Revised: 07/23/2011 Document Reviewed: 12/16/2006 [facility] Patient Information 2012 [place], [place].      Back Pain, Adult Low back pain is very  common. About 1 in 5 people have back pain.The cause of low back pain is rarely dangerous. The pain often gets better over time.About half of people with a sudden onset of back pain feel better in just 2 weeks. About 8 in 10 people feel better by 6 weeks.  CAUSES Some common causes of back pain include:  Strain of the muscles or ligaments supporting the spine.   Wear and tear (degeneration) of the spinal discs.   Arthritis.   Direct injury to the back.  DIAGNOSIS Most of the time, the direct cause of low back pain is not known.However, back pain can be treated effectively even when the exact cause of the pain is unknown.Answering your caregiver's questions about your overall health and symptoms is one of the most accurate ways to make sure the cause of your pain is not dangerous. If your caregiver needs more information, he or she may order lab work or imaging tests (X-rays or MRIs).However, even if imaging tests show changes in your back, this  usually does not require surgery. HOME CARE INSTRUCTIONS For many people, back pain returns.Since low back pain is rarely dangerous, it is often a condition that people can learn to [facility] their own.   Remain active. It is stressful on the back to sit or stand in one place. Do not sit, drive, or stand in one place for more than 30 minutes at a time. Take short walks on level surfaces as soon as pain allows.Try to increase the length of time you walk each day.   Do not stay in bed.Resting more than 1 or 2 days can delay your recovery.   Do not avoid exercise or work.Your body is made to move.It is not dangerous to be active, even though your back may hurt.Your back will likely heal faster if you return to being active before your pain is gone.   Pay attention to your body when you bend and lift. Many people have less discomfortwhen lifting if they bend their knees, keep the load close to their bodies,and avoid twisting. Often, the most comfortable positions are those that put less stress on your recovering back.   Find a comfortable position to sleep. Use a firm mattress and lie on your side with your knees slightly bent. If you lie on your back, put a pillow under your knees.   Only take over-the-counter or prescription medicines as directed by your caregiver. Over-the-counter medicines to reduce pain and inflammation are often the most helpful.Your caregiver may prescribe muscle relaxant drugs.These medicines help dull your pain so you can more quickly return to your normal activities and healthy exercise.   Put ice on the injured area.   Put ice in a plastic bag.   Place a towel between your skin and the bag.   Leave the ice on for 15 to 20 minutes, 3 to 4 times a day for the first 2 to 3 days. After that, ice and heat may be alternated to reduce pain and spasms.   Ask your caregiver about trying back exercises and gentle massage. This may be of some benefit.   Avoid feeling  anxious or stressed.Stress increases muscle tension and can worsen back pain.It is important to recognize when you are anxious or stressed and learn ways to manage it.Exercise is a great option.  SEEK MEDICAL CARE IF:  You have pain that is not relieved with rest or medicine.   You have pain that does not improve in 1  week.   You have new symptoms.   You are generally not feeling well.  SEEK IMMEDIATE MEDICAL CARE IF:   You have pain that radiates from your back into your legs.   You develop new bowel or bladder control problems.   You have unusual weakness or numbness in your arms or legs.   You develop nausea or vomiting.   You develop abdominal pain.   You feel faint.  Document Released: 08/03/2005 Document Revised: 07/23/2011 Document Reviewed: 12/22/2010 [facility] Patient Information 2012 [place], [place].

## 2012-02-03 NOTE — ED Notes (Signed)
Pt c/o mid to lower back pain, nausea, and frequent bowels x3 days.  Hx of MS and L4 and L5 degenerative disk disease.  Pt states her back hasn't bothered her in 3 months, and denies injury.

## 2012-02-03 NOTE — ED Provider Notes (Signed)
History     CSN: [id number]  Arrival date & time 02/03/12  1015   First MD Initiated Contact with Patient 02/03/12 1021      Chief Complaint  Patient presents with  . Back Pain    (Consider location/radiation/quality/duration/timing/severity/associated sxs/prior treatment) The history is provided by the patient.  31 y/o F with PMH MS, chronic back pain with DDD, HTN, chron's dz presents to ED with c/c of 3 day recurrence of chronic back pain, worse since last night. Pain located in the mid/right lumbar region with radiation to the right buttock and right leg. Severe, constant. Denies back pain red flags. Denies saddle anesthesia, urinary incontinence or rentention, fecal incontinence, numbness/weakness to LE. Denies dysuria, hematuria, vaginal discharge. Has had some nausea without emesis when pain severe, more frequent but non-watery stools since the pain began. Sx worse with movement, no alleviating factors. Took cymbalta and motrin last night without relief.   Past Medical History  Diagnosis Date  . MS (multiple sclerosis)   . Thyroid disease   . Hypertension   . Peptic ulcer   . Degenerative disk disease   . Degenerative disk disease   . Crohn's disease     Past Surgical History  Procedure Date  . Cholecystectomy   . Cesarean section     No family history on file.  History  Substance Use Topics  . Smoking status: Never Smoker   . Smokeless tobacco: Not on file  . Alcohol Use: No     Review of Systems 10 systems reviewed and are negative for acute change except as noted in the HPI.  Allergies  Aspirin; Nsaids; Penicillins; Ultram; Imitrex; Ketorolac tromethamine; and Other  Home Medications   Current Outpatient Rx  Name Route Sig Dispense Refill  . DULOXETINE HCL 60 MG PO CPEP Oral Take 60 mg by mouth daily.    . INTERFERON BETA-1B 0.3 MG [place] SOLR Subcutaneous Inject 0.25 mg into the skin every 3 (three) days.    . OMEPRAZOLE 20 MG PO CPDR Oral Take 20 mg by  mouth daily.      BP 112/57  Pulse 93  Temp 97.8 F (36.6 C) (Oral)  Resp 14  SpO2 100%  LMP 12/23/2011  Physical Exam Physical Examination: General appearance - WD, obese, appears uncomfortable lying on stretcher Eyes - sclera anicteric Mouth - MMM Heart - normal rate, regular rhythm, normal S1, S2, no murmurs, rubs, clicks or gallops Abdomen - soft, nontender, nondistended, no masses or organomegaly bowel sounds normal Pelvic - exam chaperoned by NT, normal external genitalia. Vaginal mucosa pink and moist without lesion, malodorous thick white discharge in vagina. Cervix is not easily visualized- exam limited by body habitus and pt does not tolerate full insertion of speculum. Pt unable to tolerate bimanual examination but denies vaginal pain as the cause Back exam - pain with motion noted during exam, tenderness noted right paralumbar and right flank, positive straight-leg raise on the right, normal reflexes and strength bilateral lower extremities Neurological - alert, oriented, normal speech, no focal findings or movement disorder noted Musculoskeletal - see back exam Extremities - peripheral pulses normal, no pedal edema, no clubbing or cyanosis Skin - normal coloration and turgor, no rashes, no suspicious skin lesions noted  ED Course  Procedures (including critical care time)  Labs Reviewed  URINALYSIS, ROUTINE W REFLEX MICROSCOPIC - Abnormal; Notable for the following:    APPearance CLOUDY (*)     Nitrite POSITIVE (*)     Leukocytes, UA  TRACE (*)     All other components within normal limits  URINE MICROSCOPIC-ADD ON - Abnormal; Notable for the following:    Squamous Epithelial / LPF MANY (*)     Bacteria, UA FEW (*)     All other components within normal limits  WET PREP, GENITAL - Abnormal; Notable for the following:    Clue Cells Wet Prep HPF POC MODERATE (*)     WBC, Wet Prep HPF POC FEW (*)     All other components within normal limits  POCT PREGNANCY, URINE    GC/CHLAMYDIA PROBE AMP, GENITAL   No results found.   1. Radicular pain of right lower back   2. UTI (urinary tract infection)   3. Bacterial vaginosis       MDM  Chronic back pain recurrence. Neg CT scan for kidney stone earlier this month. Pain reproducible with movement, palpation. Poss radicular component- apparently cannot tolerate NSAIDS (though she took motrin today) or steroids (per prior MD recommendation). U/a with UTI, but doubt pyelonephritis given lack of fever or tachycardia and pain in a muscular pattern rather than CVAT. Pelvic exam performed per pt request as she would like STD testing- she was unable to tolerate much of the exam (due to back pain rather than abd or pelvic pain). Wet prep with evidence of bacterial vaginosis- will tx. Will send home with muscle relaxant rx, abx for b.v. And uti. Pt to f/u with [facility] per our discussion.        Shaaron Adler, PA-C 02/03/12 1328

## 2012-02-03 NOTE — ED Notes (Signed)
WGN:[id number]<HY> Expected date:02/03/12<BR> Expected time:10:01 AM<BR> Means of arrival:Ambulance<BR> Comments:<BR> Chronic back pain

## 2012-02-03 NOTE — ED Notes (Addendum)
Per EMS, pt c/o back pain x1 day.  Picked up from home. HX of MS, crohns, kidney stones.  Alert and oriented.  Pain with palpation lower spine area and flank pain.

## 2012-02-04 LAB — GC/CHLAMYDIA PROBE AMP, GENITAL
Chlamydia, DNA Probe: NEGATIVE
GC Probe Amp, Genital: NEGATIVE

## 2012-02-06 ENCOUNTER — Encounter (HOSPITAL_COMMUNITY): Payer: Self-pay | Admitting: *Deleted

## 2012-02-06 ENCOUNTER — Emergency Department (HOSPITAL_COMMUNITY): Payer: Medicaid - Out of State

## 2012-02-06 ENCOUNTER — Emergency Department (HOSPITAL_COMMUNITY)
Admission: EM | Admit: 2012-02-06 | Discharge: 2012-02-06 | Disposition: A | Discharge status: Home / Self Care | Payer: Medicaid - Out of State | Attending: Emergency Medicine | Admitting: Emergency Medicine

## 2012-02-06 DIAGNOSIS — K5090 Crohn's disease, unspecified, without complications: Secondary | ICD-10-CM | POA: Insufficient documentation

## 2012-02-06 DIAGNOSIS — M549 Dorsalgia, unspecified: Secondary | ICD-10-CM | POA: Insufficient documentation

## 2012-02-06 DIAGNOSIS — I10 Essential (primary) hypertension: Secondary | ICD-10-CM | POA: Insufficient documentation

## 2012-02-06 DIAGNOSIS — R109 Unspecified abdominal pain: Secondary | ICD-10-CM

## 2012-02-06 DIAGNOSIS — E079 Disorder of thyroid, unspecified: Secondary | ICD-10-CM | POA: Insufficient documentation

## 2012-02-06 DIAGNOSIS — G35 Multiple sclerosis: Secondary | ICD-10-CM | POA: Insufficient documentation

## 2012-02-06 DIAGNOSIS — R1031 Right lower quadrant pain: Secondary | ICD-10-CM | POA: Insufficient documentation

## 2012-02-06 DIAGNOSIS — Z79899 Other long term (current) drug therapy: Secondary | ICD-10-CM | POA: Insufficient documentation

## 2012-02-06 LAB — COMPREHENSIVE METABOLIC PANEL
Albumin: 3.4 g/dL — ABNORMAL LOW (ref 3.5–5.2)
Alkaline Phosphatase: 111 U/L (ref 39–117)
BUN: 11 mg/dL (ref 6–23)
Chloride: 105 mEq/L (ref 96–112)
GFR calc Af Amer: >90 mL/min (ref >90)
Glucose, Bld: 92 mg/dL (ref 70–99)
Potassium: 4.2 mEq/L (ref 3.5–5.1)
Total Bilirubin: 0.1 mg/dL — ABNORMAL LOW (ref 0.3–1.2)

## 2012-02-06 LAB — URINALYSIS, ROUTINE W REFLEX MICROSCOPIC
Ketones, ur: NEGATIVE mg/dL
Leukocytes, UA: NEGATIVE
Nitrite: NEGATIVE
Protein, ur: NEGATIVE mg/dL
Urobilinogen, UA: 0.2 mg/dL (ref 0.0–1.0)
pH: 7 (ref 5.0–8.0)

## 2012-02-06 LAB — DIFFERENTIAL
Basophils Absolute: 0 10*3/uL (ref 0.0–0.1)
Basophils Relative: 0 % (ref 0–1)
Eosinophils Relative: 1 % (ref 0–5)
Lymphocytes Relative: 34 % (ref 12–46)

## 2012-02-06 LAB — CBC
MCHC: 31.1 g/dL (ref 30.0–36.0)
MCV: 80.2 fL (ref 78.0–100.0)
Platelets: 320 10*3/uL (ref 150–400)
RDW: 16.2 % — ABNORMAL HIGH (ref 11.5–15.5)
WBC: 6.3 10*3/uL (ref 4.0–10.5)

## 2012-02-06 MED ORDER — HYDROCODONE-ACETAMINOPHEN 5-325 MG PO TABS
1.0000 | ORAL_TABLET | Freq: Once | ORAL | Status: AC
  Administered 2012-02-06: 1 via ORAL
  Filled 2012-02-06: qty 1

## 2012-02-06 MED ORDER — DICYCLOMINE HCL 10 MG/ML IM SOLN: 20.0000 mg | Freq: Once | INTRAMUSCULAR | Status: DC

## 2012-02-06 MED ORDER — ONDANSETRON 4 MG PO TBDP
4.0000 mg | ORAL_TABLET | Freq: Once | ORAL | Status: AC
  Administered 2012-02-06: 4 mg via ORAL
  Filled 2012-02-06: qty 1

## 2012-02-06 NOTE — ED Notes (Signed)
Pt stated that she is not able to provide urine at this time

## 2012-02-06 NOTE — ED Notes (Signed)
This RN covering [place] for lunch, Pt called out for pain med, Dr. Ranae Palms consulted and refused to give her any more narcotic, instead bentyl was ordered. Pt informed of the Bentyl order, and then express wanting to [facility]. AMA paper signed, pt escorted out the ED.

## 2012-02-06 NOTE — ED Notes (Signed)
Pt states she has had right mid-low back pain and RLQ pain x1 week, worsening over past 24 hours.  Pt was seen here in ED on 6/21 for same and dx with low back pain, UTI and baceterial vaginosis.  Pt states she is taking her bactrim and flagyl, which has caused some N/V.  Pt states she broke out in a rash after taking percocet for pain, so she has been taking ibuprofen with no relief.  Pt denies dysuria, hematuria, urinary frequency/urgency and vaginal discharge.

## 2012-02-06 NOTE — ED Provider Notes (Signed)
History     CSN: [id number]  Arrival date & time 02/06/12  0915   First MD Initiated Contact with Patient 02/06/12 [phone]      Chief Complaint  Patient presents with  . Back Pain  . Abdominal Pain    (Consider location/radiation/quality/duration/timing/severity/associated sxs/prior treatment) HPI Pt was seen 3 days ago and dx with lumbar radiculopathy, UTI and bacterial vaginosis. Prescribed oxycodone, flagyl and bactrim. States that she broke out in a rash due to oxycodone which abated with benadryl. States she has had hydrocodone in the past without reaction. Pt came back in today saying that yesterday she developed RLQ and R upper thigh pain which is new. She has had several episodes of vomiting which is normal for the patient when she experiences pain. No fever, chills. States she has been compliant with antibiotics. No focal weakness, sensory changes or incontinence  Past Medical History  Diagnosis Date  . MS (multiple sclerosis)   . Thyroid disease   . Hypertension   . Peptic ulcer   . Degenerative disk disease   . Degenerative disk disease   . Crohn's disease     Past Surgical History  Procedure Date  . Cholecystectomy   . Cesarean section     No family history on file.  History  Substance Use Topics  . Smoking status: Never Smoker   . Smokeless tobacco: Not on file  . Alcohol Use: No    OB History    Grav Para Term Preterm Abortions TAB SAB Ect Mult Living            3      Review of Systems  Constitutional: Negative for fever and chills.  Respiratory: Negative for shortness of breath.   Cardiovascular: Negative for chest pain.  Gastrointestinal: Positive for nausea, vomiting and abdominal pain. Negative for diarrhea and constipation.  Genitourinary: Negative for dysuria, flank pain, vaginal bleeding and vaginal discharge.  Musculoskeletal: Positive for back pain.  Skin: Negative for rash and wound.  Neurological: Negative for dizziness, weakness,  light-headedness and numbness.    Allergies  Percocet; Prednisone; Aspirin; Nsaids; Penicillins; Ultram; Imitrex; Ketorolac tromethamine; and Other  Home Medications   Current Outpatient Rx  Name Route Sig Dispense Refill  . DIAZEPAM 5 MG PO TABS Oral Take 1 tablet (5 mg total) by mouth every 8 (eight) hours as needed (for muscle spasm). 12 tablet 0  . DULOXETINE HCL 60 MG PO CPEP Oral Take 60 mg by mouth daily.    . IBUPROFEN 800 MG PO TABS Oral Take 800 mg by mouth every 8 (eight) hours as needed. For pain.    . INTERFERON BETA-1B 0.3 MG [place] SOLR Subcutaneous Inject 0.25 mg into the skin every 3 (three) days.    Mar[place] Kitchen METRONIDAZOLE 500 MG PO TABS Oral Take 1 tablet (500 mg total) by mouth 2 (two) times daily. 14 tablet 0  . OMEPRAZOLE 20 MG PO CPDR Oral Take 20 mg by mouth daily.    . SULFAMETHOXAZOLE-TRIMETHOPRIM 800-160 MG PO TABS Oral Take 1 tablet by mouth every 12 (twelve) hours. 10 tablet 0    BP 135/84  Pulse 89  Temp 97.8 F (36.6 C) (Oral)  Resp 18  SpO2 100%  LMP 01/23/2012  Physical Exam  Nursing note and vitals reviewed. Constitutional: She is oriented to person, place, and time. She appears well-developed and well-nourished. No distress.  HENT:  Head: Normocephalic and atraumatic.  Mouth/Throat: Oropharynx is clear and moist.  Eyes: EOM are normal. Pupils  are equal, round, and reactive to light.  Neck: Normal range of motion. Neck supple.  Cardiovascular: Normal rate and regular rhythm.   Pulmonary/Chest: Effort normal and breath sounds normal. No respiratory distress. She has no wheezes. She has no rales.  Abdominal: Soft. Bowel sounds are normal. She exhibits no distension. There is tenderness (mild RLQ tenderness). There is no rebound and no guarding.  Musculoskeletal: Normal range of motion. She exhibits tenderness (R paraspinal lumbar TTP, No rash deformity or trauma). She exhibits no edema.  Neurological: She is alert and oriented to person, place, and time.        5/5 motor, sensation intact.   Skin: Skin is warm and dry. No rash noted. No erythema.  Psychiatric: She has a normal mood and affect. Her behavior is normal.    ED Course  Procedures (including critical care time)  Labs Reviewed  COMPREHENSIVE METABOLIC PANEL - Abnormal; Notable for the following:    Albumin 3.4 (*)     Total Bilirubin 0.1 (*)     All other components within normal limits  CBC - Abnormal; Notable for the following:    Hemoglobin 9.8 (*)     HCT 31.5 (*)     MCH 24.9 (*)     RDW 16.2 (*)     All other components within normal limits  DIFFERENTIAL  URINALYSIS, ROUTINE W REFLEX MICROSCOPIC  PREGNANCY, URINE   Ct Abdomen Pelvis Wo Contrast  02/06/2012  *RADIOLOGY REPORT*  Clinical Data: Right lower quadrant pain. History of Crohn's disease.  CT ABDOMEN AND PELVIS WITHOUT CONTRAST  Technique:  Multidetector CT imaging of the abdomen and pelvis was performed following the standard protocol without intravenous contrast.  Comparison: 01/17/2012  Findings: The lung bases are clear.  No evidence for intraperitoneal air.  Evaluation of the intra-abdominal organs is limited without intravenous contrast.  The gallbladder has been removed.  No gross abnormality to the liver, spleen, pancreas, adrenal glands or kidneys.  There has been mild enlargement of the left adnexa tissue which may be related to a cyst or follicles.  No evidence for free fluid. Limited evaluation of the right adnexa due to adjacent bowel loops.  There are multiple small lymph nodes in the mesentery.  Evidence for prior appendectomy. There may be mild mural wall thickening of the terminal ileum but no evidence for acute inflammation.  The findings in the mesentery and terminal ileum could be related to Crohn's disease.   Oral contrast is in the proximal small bowel and limited evaluation of the distal small bowel due to the lack of oral contrast and motion artifact.  No evidence for small bowel dilatation.  Fluid  in the urinary bladder.  No acute bony abnormality.  IMPRESSION:  Left adnexa tissue is prominent but poorly characterized on this noncontrast CT exam.  Ovarian cyst or follicles cannot be excluded. If further evaluation of the adnexa tissue is needed, recommend a pelvic ultrasound examination.  No acute inflammatory changes within the abdomen or pelvis.  Multiple small mesenteric lymph nodes and there may be chronic changes involving the terminal ileum.  Findings could be associated with history of Crohn's disease.  Original Report Authenticated By: Richarda Overlie, M.D.     1. Abdominal pain       MDM  Recent pelvic exam and CT abd/pelvis w/o contrast. Though I believe it is of low likelihood, will r/o intra-abdominal process with CT abd/pelvis with contrast. Concern for drug seeking behavior.    Pt left  AMA before CT resulted because she was not getting narcotic. She refused her IV. Blood and urine show resolved UTI and improving WBC. CT shows moderate stool burden in R colon.   Loren Racer, MD 02/06/12 [phone]

## 2012-02-09 ENCOUNTER — Encounter (HOSPITAL_COMMUNITY): Payer: Self-pay | Admitting: *Deleted

## 2012-02-09 ENCOUNTER — Emergency Department (HOSPITAL_COMMUNITY)
Admission: EM | Admit: 2012-02-09 | Discharge: 2012-02-09 | Disposition: A | Discharge status: Home / Self Care | Payer: Medicaid - Out of State | Attending: Emergency Medicine | Admitting: Emergency Medicine

## 2012-02-09 ENCOUNTER — Emergency Department (HOSPITAL_COMMUNITY): Payer: Medicaid - Out of State

## 2012-02-09 DIAGNOSIS — IMO0002 Reserved for concepts with insufficient information to code with codable children: Secondary | ICD-10-CM | POA: Insufficient documentation

## 2012-02-09 DIAGNOSIS — K5090 Crohn's disease, unspecified, without complications: Secondary | ICD-10-CM | POA: Insufficient documentation

## 2012-02-09 DIAGNOSIS — E079 Disorder of thyroid, unspecified: Secondary | ICD-10-CM | POA: Insufficient documentation

## 2012-02-09 DIAGNOSIS — X500XXA Overexertion from strenuous movement or load, initial encounter: Secondary | ICD-10-CM | POA: Insufficient documentation

## 2012-02-09 DIAGNOSIS — I10 Essential (primary) hypertension: Secondary | ICD-10-CM | POA: Insufficient documentation

## 2012-02-09 DIAGNOSIS — Y9301 Activity, walking, marching and hiking: Secondary | ICD-10-CM | POA: Insufficient documentation

## 2012-02-09 DIAGNOSIS — Y998 Other external cause status: Secondary | ICD-10-CM | POA: Insufficient documentation

## 2012-02-09 DIAGNOSIS — Y92009 Unspecified place in unspecified non-institutional (private) residence as the place of occurrence of the external cause: Secondary | ICD-10-CM | POA: Insufficient documentation

## 2012-02-09 DIAGNOSIS — S93609A Unspecified sprain of unspecified foot, initial encounter: Secondary | ICD-10-CM | POA: Insufficient documentation

## 2012-02-09 DIAGNOSIS — G35 Multiple sclerosis: Secondary | ICD-10-CM | POA: Insufficient documentation

## 2012-02-09 MED ORDER — OXYCODONE-ACETAMINOPHEN 5-325 MG PO TABS
2.0000 | ORAL_TABLET | Freq: Once | ORAL | Status: AC
  Administered 2012-02-09: 2 via ORAL
  Filled 2012-02-09: qty 2

## 2012-02-09 MED ORDER — OXYCODONE-ACETAMINOPHEN 5-325 MG PO TABS: 2.0000 | ORAL_TABLET | ORAL | Status: DC | PRN

## 2012-02-09 NOTE — ED Provider Notes (Addendum)
History   This chart was scribed for Jenna Shi, MD by Jenna Henderson. The patient was seen in room TR04C/TR04C and the patient's care was started at 3:29 PM     CSN: [id number]  Arrival date & time 02/09/12  1352   None     Chief Complaint  Patient presents with  . Foot Pain    (Consider location/radiation/quality/duration/timing/severity/associated sxs/prior treatment) Patient is a 31 y.o. female presenting with lower extremity pain. The history is provided by the patient. No language interpreter was used.  Foot Pain This is a new problem. The current episode started yesterday. The problem occurs constantly. The problem has not changed since onset.Pertinent negatives include no headaches and no shortness of breath. The symptoms are aggravated by walking. Nothing relieves the symptoms. She has tried nothing for the symptoms. The treatment provided no relief.    Jenna Henderson is a 31 y.o. female who presents to the Emergency Department complaining of moderate, episodic foot pain located at the left foot onset one day ago with associated symptoms of radiating left leg pain, swelling located at the left foot. The pt informs the EDP that she was walking up the stairs yesterday at home where she twisted her left foot. Modifying factors include application of pressure on the left foot which intensifies the pain.   Pt denies employment, falling associated with the left foot pain.      Past Medical History  Diagnosis Date  . MS (multiple sclerosis)   . Thyroid disease   . Hypertension   . Peptic ulcer   . Degenerative disk disease   . Degenerative disk disease   . Crohn's disease     Past Surgical History  Procedure Date  . Cholecystectomy   . Cesarean section      History  Substance Use Topics  . Smoking status: Never Smoker   . Smokeless tobacco: Not on file  . Alcohol Use: No    OB History    Grav Para Term Preterm Abortions TAB SAB Ect Mult Living            3        Review of Systems  Respiratory: Negative for shortness of breath.   Neurological: Negative for headaches.  All other systems reviewed and are negative.    Allergies  Percocet; Prednisone; Aspirin; Nsaids; Penicillins; Ultram; Imitrex; Ketorolac tromethamine; and Other  Home Medications   Current Outpatient Rx  Name Route Sig Dispense Refill  . DICYCLOMINE HCL 10 MG PO CAPS Oral Take 10 mg by mouth 4 (four) times daily -  before meals and at bedtime.    . DULOXETINE HCL 60 MG PO CPEP Oral Take 60 mg by mouth daily.    . IBUPROFEN 800 MG PO TABS Oral Take 800 mg by mouth every 8 (eight) hours as needed. For pain.    . INTERFERON BETA-1B 0.3 MG [place] SOLR Subcutaneous Inject 0.25 mg into the skin every other day.    Marland Kitchen METRONIDAZOLE 500 MG PO TABS Oral Take 1 tablet (500 mg total) by mouth 2 (two) times daily. 14 tablet 0  . OMEPRAZOLE 20 MG PO CPDR Oral Take 20 mg by mouth daily.    . SULFAMETHOXAZOLE-TRIMETHOPRIM 800-160 MG PO TABS Oral Take 1 tablet by mouth every 12 (twelve) hours. 10 tablet 0  . OXYCODONE-ACETAMINOPHEN 5-325 MG PO TABS Oral Take 2 tablets by mouth every 4 (four) hours as needed for pain. 15 tablet 0    BP 107/72  Pulse 83  Temp 98.3 F (36.8 C) (Oral)  Resp 20  SpO2 99%  LMP 01/23/2012  Physical Exam  Nursing note and vitals reviewed. Constitutional: She is oriented to person, place, and time. She appears well-developed and well-nourished. No distress.  HENT:  Head: Normocephalic and atraumatic.  Eyes: Pupils are equal, round, and reactive to light.  Neck: Normal range of motion.  Cardiovascular: Normal rate and intact distal pulses.   Pulmonary/Chest: No respiratory distress.  Abdominal: Normal appearance. She exhibits no distension.  Musculoskeletal:       Left foot: She exhibits decreased range of motion and tenderness.       Feet:       Pain on dorsum of the left foot.   Neurological: She is alert and oriented to person, place, and time. No  cranial nerve deficit.  Skin: Skin is warm and dry. No rash noted.  Psychiatric: She has a normal mood and affect. Her behavior is normal.    ED Course  Procedures (including critical care time)  DIAGNOSTIC STUDIES: Oxygen Saturation is 99% on room air, normal by my interpretation.    COORDINATION OF CARE:  3:32PM- EDP at bedside discusses treatment plan concerning x-ray results. Pt requests a wrap instead of crutches.    Labs Reviewed - No data to display    Dg Foot Complete Left  02/09/2012  *RADIOLOGY REPORT*  Clinical Data: 2-day history of mid anterior left foot pain.  No known injuries.  LEFT FOOT - COMPLETE 3+ VIEW  Comparison: No prior foot imaging.  Left ankle x-rays 01/05/2009.  Findings: Minimal hallux valgus. No evidence of acute or subacute fracture or dislocation.  Well-preserved joint spaces.  Well- preserved bone mineral density.  No other intrinsic osseous abnormalities.  IMPRESSION: Minimal hallux valgus.  Normal examination otherwise.  Original Report Authenticated By: Arnell Sieving, M.D.      1. Foot sprain       MDM        I personally performed the services described in this documentation, which was scribed in my presence. The recorded information has been reviewed and considered.     Jenna Shi, MD 02/09/12 1550  Jenna Shi, MD 02/09/12 [phone]

## 2012-02-09 NOTE — Discharge Instructions (Signed)
Foot Sprain You have a sprained foot. When you twist your foot, the ligaments that hold the joints together are injured. This may cause pain, swelling, bruising, and difficulty walking. Proper treatment will shorten your disability and help you prevent re-injury. To treat a sprained foot you should:  Elevate your foot for the next 2-3 days to reduce swelling.   Apply ice packs to the foot for 20-30 minutes every 2-3 hours.   Wrap your foot with a compression bandage as long as it is swollen or tender.   Do not walk on your foot if it still hurts a lot.  Use crutches or a cane until weight bearing becomes painless.   Special podiatric shoes or shoes with rigid soles may be useful in allowing earlier walking.  Only take over-the-counter or prescription medicines for pain, discomfort, or fever as directed by your caregiver. Most foot sprains will heal completely in 3-6 weeks with proper rest.  If you still have pain or swelling after 2-3 weeks, or if your pain worsens, you should see your doctor for further evaluation. Document Released: 09/10/2004 Document Revised: 07/23/2011 Document Reviewed: 08/04/2008 [employer] Patient Information 2012 [employer]. 

## 2012-02-09 NOTE — ED Notes (Signed)
Patient with left foot pain x 1 day, patient denies injury, patient states she has been walking more frequently but cannot remember injury, patient with slight swelling in left foot

## 2012-02-12 ENCOUNTER — Encounter (HOSPITAL_COMMUNITY): Payer: Self-pay

## 2012-02-12 ENCOUNTER — Emergency Department (HOSPITAL_COMMUNITY)
Admission: EM | Admit: 2012-02-12 | Discharge: 2012-02-12 | Disposition: A | Discharge status: Home / Self Care | Payer: Medicaid - Out of State | Attending: Emergency Medicine | Admitting: Emergency Medicine

## 2012-02-12 DIAGNOSIS — G35 Multiple sclerosis: Secondary | ICD-10-CM | POA: Insufficient documentation

## 2012-02-12 DIAGNOSIS — IMO0002 Reserved for concepts with insufficient information to code with codable children: Secondary | ICD-10-CM | POA: Insufficient documentation

## 2012-02-12 DIAGNOSIS — S93609A Unspecified sprain of unspecified foot, initial encounter: Secondary | ICD-10-CM | POA: Insufficient documentation

## 2012-02-12 DIAGNOSIS — S93602A Unspecified sprain of left foot, initial encounter: Secondary | ICD-10-CM

## 2012-02-12 DIAGNOSIS — X500XXA Overexertion from strenuous movement or load, initial encounter: Secondary | ICD-10-CM | POA: Insufficient documentation

## 2012-02-12 DIAGNOSIS — Z9089 Acquired absence of other organs: Secondary | ICD-10-CM | POA: Insufficient documentation

## 2012-02-12 MED ORDER — OXYCODONE-ACETAMINOPHEN 5-325 MG PO TABS
2.0000 | ORAL_TABLET | Freq: Once | ORAL | Status: AC
  Administered 2012-02-12: 2 via ORAL
  Filled 2012-02-12: qty 2

## 2012-02-12 MED ORDER — OXYCODONE-ACETAMINOPHEN 5-325 MG PO TABS: 2.0000 | ORAL_TABLET | Freq: Four times a day (QID) | ORAL | Status: DC | PRN

## 2012-02-12 MED ORDER — ONDANSETRON 4 MG PO TBDP
8.0000 mg | ORAL_TABLET | Freq: Once | ORAL | Status: AC
  Administered 2012-02-12: 8 mg via ORAL
  Filled 2012-02-12: qty 2

## 2012-02-12 MED ORDER — DIPHENHYDRAMINE HCL 25 MG PO CAPS
50.0000 mg | ORAL_CAPSULE | Freq: Once | ORAL | Status: AC
  Administered 2012-02-12: 50 mg via ORAL
  Filled 2012-02-12: qty 2

## 2012-02-12 MED ORDER — ONDANSETRON HCL 8 MG PO TABS: 8.0000 mg | ORAL_TABLET | Freq: Three times a day (TID) | ORAL | Status: AC | PRN

## 2012-02-12 NOTE — ED Notes (Signed)
Pt presents with continued L foot pain after falling up stairs on Monday.  Pt seen here for same, had imaging and discharged.  She is able to bear weight, but with some difficulty.  Pt reports red "rash" above L foot that she reports is painful.

## 2012-02-12 NOTE — Discharge Instructions (Signed)
Foot Sprain You have a sprained foot. When you twist your foot, the ligaments that hold the joints together are injured. This may cause pain, swelling, bruising, and difficulty walking. Proper treatment will shorten your disability and help you prevent re-injury. To treat a sprained foot you should:  Elevate your foot for the next 2-3 days to reduce swelling.   Apply ice packs to the foot for 20-30 minutes every 2-3 hours.   Wrap your foot with a compression bandage as long as it is swollen or tender.   Do not walk on your foot if it still hurts a lot.  Use crutches or a cane until weight bearing becomes painless.   Special podiatric shoes or shoes with rigid soles may be useful in allowing earlier walking.  Only take over-the-counter or prescription medicines for pain, discomfort, or fever as directed by your caregiver. Most foot sprains will heal completely in 3-6 weeks with proper rest.  If you still have pain or swelling after 2-3 weeks, or if your pain worsens, you should see your doctor for further evaluation. Document Released: 09/10/2004 Document Revised: 07/23/2011 Document Reviewed: 08/04/2008 [facility] Patient Information 2012 [place], [place].  Use your walker and a postop shoe with limited weightbearing to the left foot until recheck. Return sooner for new weakness numbness color change to your foot or other concerns.

## 2012-02-12 NOTE — Progress Notes (Signed)
Orthopedic Tech Progress Note Patient Details:  Jenna Henderson Feb 21, 1981 [id number]  Ortho Devices Type of Ortho Device: Postop boot Ortho Device/Splint Location: left foot Ortho Device/Splint Interventions: Application   Jenna Henderson 02/12/2012, 3:34 PM

## 2012-02-12 NOTE — ED Provider Notes (Signed)
History   This chart was scribed for No att. providers found by Toya Smothers. The patient was seen in room TR07C/TR07C. Patient's care was started at 1423.  CSN: [id number]  Arrival date & time 02/12/12  1423   First MD Initiated Contact with Patient 02/12/12 1503      Chief Complaint  Patient presents with  . Foot Pain   HPI  Jenna Henderson is a 31 y.o. female who presents to the Emergency Department complaining of gradual onset moderate severe constant L foot pain. Pt reports that sh twisted foot walking up stairs several days ago, and pain has been gradually worsening. Pt is currently taking percocet with minimal relief. Overall pain is worsening.  Past Medical History  Diagnosis Date  . MS (multiple sclerosis)   . Thyroid disease   . Hypertension   . Peptic ulcer   . Degenerative disk disease   . Degenerative disk disease   . Crohn's disease     Past Surgical History  Procedure Date  . Cholecystectomy   . Cesarean section     No family history on file.  History  Substance Use Topics  . Smoking status: Never Smoker   . Smokeless tobacco: Not on file  . Alcohol Use: No   Review of Systems  Musculoskeletal:       L foot Pain    Allergies  Prednisone; Aspirin; Nsaids; Penicillins; Ultram; Imitrex; Ketorolac tromethamine; and Other  Home Medications   Current Outpatient Rx  Name Route Sig Dispense Refill  . DICYCLOMINE HCL 10 MG PO CAPS Oral Take 10 mg by mouth 4 (four) times daily -  before meals and at bedtime.    . DULOXETINE HCL 60 MG PO CPEP Oral Take 60 mg by mouth daily.    . IBUPROFEN 800 MG PO TABS Oral Take 800 mg by mouth every 8 (eight) hours as needed. For pain.    . INTERFERON BETA-1B 0.3 MG [place] SOLR Subcutaneous Inject 0.25 mg into the skin every other day.    Marland Kitchen METRONIDAZOLE 500 MG PO TABS Oral Take 500 mg by mouth 2 (two) times daily. Started 6/19 for 14 days    . OMEPRAZOLE 20 MG PO CPDR Oral Take 20 mg by mouth daily.    .  OXYCODONE-ACETAMINOPHEN 5-325 MG PO TABS Oral Take 2 tablets by mouth every 6 (six) hours as needed for pain. 20 tablet 0    BP 151/99  Pulse 109  Temp 97.9 F (36.6 C) (Oral)  Resp 20  Ht 5\' 5"  (1.651 m)  Wt 268 lb (121.564 kg)  BMI 44.60 kg/m2  SpO2 99%  LMP 01/16/2012  Physical Exam  Nursing note and vitals reviewed. Constitutional:       Awake, alert, nontoxic appearance.  HENT:  Head: Atraumatic.  Eyes: Right eye exhibits no discharge. Left eye exhibits no discharge.  Neck: Neck supple.  Pulmonary/Chest: Effort normal. She exhibits no tenderness.  Abdominal: Soft. There is no tenderness. There is no rebound.  Musculoskeletal: She exhibits no tenderness.       DP CT intact. CR less than 2 sec. Non-tender thigh, ankle, calf, heel, kellies tendon, and forefoot . Dorsal and plantar flex intact.  Neurological:       Mental status and motor strength appears baseline for patient and situation.  Skin: No rash noted.  Psychiatric: She has a normal mood and affect.    ED Course  Procedures (including critical care time) DIAGNOSTIC STUDIES: Oxygen Saturation is 99% on room  air, normal by my interpretation.    COORDINATION OF CARE: 1509- May need follow up x ray. Pt should follow up with    Labs Reviewed - No data to display No results found.   1. Sprain of left foot       MDM  Pt stable in ED with no significant deterioration in condition.Patient / Family / Caregiver informed of clinical course, understand medical decision-making process, and agree with plan. I personally performed the services described in this documentation, which was scribed in my presence. The recorded information has been reviewed and considered.         Hurman Horn, MD 02/21/12 [phone]

## 2012-02-12 NOTE — ED Notes (Signed)
Pt. Stated, I hurt my foot on Mon I fell on it after the other leg went back.  They told me to come back if its no better, and its no better

## 2012-02-12 NOTE — ED Notes (Signed)
Pt d/c home in NAD. Pt voiced understanding of d/c instructions and follow up care. Pt instructed not to drive after taking percocet.  

## 2012-02-23 ENCOUNTER — Emergency Department (HOSPITAL_COMMUNITY)
Admission: EM | Admit: 2012-02-23 | Discharge: 2012-02-23 | Disposition: A | Discharge status: Home / Self Care | Payer: Medicaid - Out of State | Attending: Emergency Medicine | Admitting: Emergency Medicine

## 2012-02-23 ENCOUNTER — Encounter (HOSPITAL_COMMUNITY): Payer: Self-pay | Admitting: Emergency Medicine

## 2012-02-23 DIAGNOSIS — E079 Disorder of thyroid, unspecified: Secondary | ICD-10-CM | POA: Insufficient documentation

## 2012-02-23 DIAGNOSIS — S93602A Unspecified sprain of left foot, initial encounter: Secondary | ICD-10-CM

## 2012-02-23 DIAGNOSIS — IMO0002 Reserved for concepts with insufficient information to code with codable children: Secondary | ICD-10-CM | POA: Insufficient documentation

## 2012-02-23 DIAGNOSIS — K279 Peptic ulcer, site unspecified, unspecified as acute or chronic, without hemorrhage or perforation: Secondary | ICD-10-CM | POA: Insufficient documentation

## 2012-02-23 DIAGNOSIS — Z760 Encounter for issue of repeat prescription: Secondary | ICD-10-CM | POA: Insufficient documentation

## 2012-02-23 DIAGNOSIS — I10 Essential (primary) hypertension: Secondary | ICD-10-CM | POA: Insufficient documentation

## 2012-02-23 DIAGNOSIS — Z880 Allergy status to penicillin: Secondary | ICD-10-CM | POA: Insufficient documentation

## 2012-02-23 DIAGNOSIS — W108XXA Fall (on) (from) other stairs and steps, initial encounter: Secondary | ICD-10-CM | POA: Insufficient documentation

## 2012-02-23 DIAGNOSIS — K5090 Crohn's disease, unspecified, without complications: Secondary | ICD-10-CM | POA: Insufficient documentation

## 2012-02-23 DIAGNOSIS — G35 Multiple sclerosis: Secondary | ICD-10-CM | POA: Insufficient documentation

## 2012-02-23 DIAGNOSIS — Z888 Allergy status to other drugs, medicaments and biological substances status: Secondary | ICD-10-CM | POA: Insufficient documentation

## 2012-02-23 DIAGNOSIS — S93609A Unspecified sprain of unspecified foot, initial encounter: Secondary | ICD-10-CM | POA: Insufficient documentation

## 2012-02-23 DIAGNOSIS — Z881 Allergy status to other antibiotic agents status: Secondary | ICD-10-CM | POA: Insufficient documentation

## 2012-02-23 MED ORDER — CYCLOBENZAPRINE HCL 10 MG PO TABS: 10.0000 mg | ORAL_TABLET | Freq: Three times a day (TID) | ORAL | Status: AC | PRN
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
## 2012-02-23 NOTE — ED Notes (Signed)
Ortho tech paged  

## 2012-02-23 NOTE — ED Notes (Signed)
NAD noted at time of d/c home. Pt verbalized understanding of d/c inst. 

## 2012-02-23 NOTE — Progress Notes (Signed)
Orthopedic Tech Progress Note Patient Details:  Jenna Henderson Apr 13, 1981 [id number]  Ortho Devices Type of Ortho Device: Postop boot Ortho Device/Splint Interventions: Application   Cammer, Mickie Bail 02/23/2012, 12:52 PM
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
## 2012-02-27 ENCOUNTER — Emergency Department (HOSPITAL_COMMUNITY): Payer: Medicaid - Out of State

## 2012-02-27 ENCOUNTER — Encounter (HOSPITAL_COMMUNITY): Payer: Self-pay | Admitting: *Deleted

## 2012-02-27 ENCOUNTER — Emergency Department (HOSPITAL_COMMUNITY)
Admission: EM | Admit: 2012-02-27 | Discharge: 2012-02-27 | Disposition: A | Discharge status: Home / Self Care | Payer: Medicaid - Out of State | Attending: Emergency Medicine | Admitting: Emergency Medicine

## 2012-02-27 DIAGNOSIS — IMO0002 Reserved for concepts with insufficient information to code with codable children: Secondary | ICD-10-CM | POA: Insufficient documentation

## 2012-02-27 DIAGNOSIS — S8990XA Unspecified injury of unspecified lower leg, initial encounter: Secondary | ICD-10-CM | POA: Insufficient documentation

## 2012-02-27 DIAGNOSIS — G35 Multiple sclerosis: Secondary | ICD-10-CM | POA: Insufficient documentation

## 2012-02-27 DIAGNOSIS — S99929A Unspecified injury of unspecified foot, initial encounter: Secondary | ICD-10-CM

## 2012-02-27 DIAGNOSIS — E079 Disorder of thyroid, unspecified: Secondary | ICD-10-CM | POA: Insufficient documentation

## 2012-02-27 DIAGNOSIS — I10 Essential (primary) hypertension: Secondary | ICD-10-CM | POA: Insufficient documentation

## 2012-02-27 DIAGNOSIS — S99919A Unspecified injury of unspecified ankle, initial encounter: Secondary | ICD-10-CM | POA: Insufficient documentation

## 2012-02-27 MED ORDER — OXYCODONE-ACETAMINOPHEN 5-325 MG PO TABS
2.0000 | ORAL_TABLET | Freq: Once | ORAL | Status: AC
  Administered 2012-02-27: 2 via ORAL
  Filled 2012-02-27: qty 2

## 2012-02-27 NOTE — ED Provider Notes (Signed)
History     CSN: [id number]  Arrival date & time 02/27/12  1625   First MD Initiated Contact with Patient 02/27/12 1626      Chief Complaint  Patient presents with  . Ankle Pain  . Toe Pain    (Consider location/radiation/quality/duration/timing/severity/associated sxs/prior treatment) HPI Comments: Patient reports that just prior to arrival she tripped on her flip flops and stubbed her right great toe on the concrete and rolled her right ankle.  She is currently having pain of the right great toe and also pain over the lateral malleolus.  Minimal swelling of the right great toe.  No swelling of the ankle noted.  She has not taken anything for pain.  She has a history of MS and currently uses a walker to ambulate.   She came via EMS and states that she is unable to bear weight.    Patient is a 31 y.o. female presenting with ankle pain and toe pain. The history is provided by the patient.  Ankle Pain  The incident occurred less than 1 hour ago. The incident occurred in the street. Associated symptoms include inability to bear weight. Pertinent negatives include no numbness, no loss of motion, no loss of sensation and no tingling. She reports no foreign bodies present.  Toe Pain This is a new problem. The current episode started today. The problem occurs constantly. Pertinent negatives include no chills, fever, joint swelling, nausea, numbness or vomiting. The symptoms are aggravated by bending and walking. She has tried nothing for the symptoms.    Past Medical History  Diagnosis Date  . MS (multiple sclerosis)   . Thyroid disease   . Hypertension   . Peptic ulcer   . Degenerative disk disease   . Degenerative disk disease   . Crohn's disease     Past Surgical History  Procedure Date  . Cholecystectomy   . Cesarean section     No family history on file.  History  Substance Use Topics  . Smoking status: Never Smoker   . Smokeless tobacco: Not on file  . Alcohol Use: No      OB History    Grav Para Term Preterm Abortions TAB SAB Ect Mult Living            3      Review of Systems  Constitutional: Negative for fever and chills.  Gastrointestinal: Negative for nausea and vomiting.  Musculoskeletal: Negative for joint swelling.  Skin: Positive for wound.  Neurological: Negative for tingling and numbness.    Allergies  Prednisone; Aspirin; Nsaids; Penicillins; Ultram; Imitrex; Ketorolac tromethamine; and Other  Home Medications   Current Outpatient Rx  Name Route Sig Dispense Refill  . CYCLOBENZAPRINE HCL 10 MG PO TABS Oral Take 1 tablet (10 mg total) by mouth 3 (three) times daily as needed for muscle spasms. 30 tablet 0  . DICYCLOMINE HCL 10 MG PO CAPS Oral Take 10 mg by mouth 4 (four) times daily -  before meals and at bedtime.    . FLUOXETINE HCL 10 MG PO CAPS Oral Take 10 mg by mouth daily.    . INTERFERON BETA-1B 0.3 MG  SOLR Subcutaneous Inject 0.25 mg into the skin every other day.    Marland Kitchen OMEPRAZOLE 20 MG PO CPDR Oral Take 20 mg by mouth daily.    . QUETIAPINE FUMARATE ER 400 MG PO TB24 Oral Take 400 mg by mouth at bedtime.      BP 153/85  Pulse 90  Temp  98.5 F (36.9 C) (Oral)  Resp 18  SpO2 100%  LMP 01/16/2012  Physical Exam  Nursing note and vitals reviewed. Constitutional: She appears well-developed and well-nourished. No distress.  HENT:  Head: Normocephalic and atraumatic.  Neck: Normal range of motion. Neck supple.  Cardiovascular: Normal rate, regular rhythm, normal heart sounds and intact distal pulses.   Pulses:      Dorsalis pedis pulses are 2+ on the right side, and 2+ on the left side.  Pulmonary/Chest: Effort normal and breath sounds normal.  Musculoskeletal: Normal range of motion.  Neurological: She is alert. No sensory deficit.  Skin: Skin is warm and dry. She is not diaphoretic.       Small skin tear of the right great toe just distal to the toe nail.  Psychiatric: She has a normal mood and affect.    ED  Course  NERVE BLOCK Performed by: Anne Shutter, Evanne Matsunaga Authorized by: Anne Shutter, Herbert Seta Consent: Verbal consent obtained. Risks and benefits: risks, benefits and alternatives were discussed Consent given by: patient Patient understanding: patient states understanding of the procedure being performed Patient consent: the patient's understanding of the procedure matches consent given Patient identity confirmed: verbally with patient Indications: pain relief Nerve block body site: great toe. Laterality: right Patient sedated: no Needle gauge: 24 G Local anesthetic: lidocaine 2% without epinephrine Outcome: pain improved Patient tolerance: Patient tolerated the procedure well with no immediate complications.   (including critical care time)  Labs Reviewed - No data to display Dg Ankle Complete Right  02/27/2012  *RADIOLOGY REPORT*  Clinical Data: Fall.  Ankle injury and pain.  RIGHT ANKLE - COMPLETE 3+ VIEW  Comparison: None.  Findings: No evidence of acute fracture or dislocation.  No evidence of ankle joint effusion arthropathy.  A tiny well corticated exostosis or old avulsion is seen along the medial malleolus.  No other bone abnormality identified  IMPRESSION: No acute findings.  Original Report Authenticated By: Danae Orleans, M.D.   Dg Foot Complete Right  02/27/2012  *RADIOLOGY REPORT*  Clinical Data: Fall.  Foot injury.  Foot pain mainly in the area of great toe.  RIGHT FOOT COMPLETE - 3+ VIEW  Comparison: None.  Findings: No evidence of fracture or dislocation.  No other significant bone abnormality identified.  Mild soft tissue swelling of the distal great toe noted.  IMPRESSION: Mild distal great toe soft tissue swelling.  No evidence of fracture.  Original Report Authenticated By: Danae Orleans, M.D.     1. Toe injury     Digital block performed and area irrigated well.     Patient requesting Narcotic Pain Medication. She reports that she can not take any NSAIDS.  Looked  patient up in the Narcotic Database.  Patient has had several recent Narcotic prescriptions from ED providers.  Patient has been seen in the ED numerous times for foot pain in the past month.  After patient was told that she was not going to get a prescription for a narcotic, she got up and walked out of the ED prior to the Nurse going over discharge paperwork.    MDM  Negative xray.  Neurovascularly intact.  Pain treated while in the ED, but not given prescription for Narcotic medication due to reasons listed above.  Patient instructed to take Tylenol.  Patient given post op boot and discharged home.          Pascal Lux [place], PA-C 02/27/12 2146

## 2012-02-27 NOTE — ED Notes (Signed)
Pt reports she has history of MS and uses a walker at home. Pt reports tripped on her flip flop and stubbed right great toe and twisted right ankle.  Pt denies being able to put weight on foot.

## 2012-02-27 NOTE — ED Notes (Signed)
Pt not seen in room. Pt seen by registration leaving in no apparent distress and ambulatory. Pt did not stay for discharge paperwork. PA aware.

## 2012-02-27 NOTE — ED Provider Notes (Signed)
Medical screening examination/treatment/procedure(s) were performed by non-physician practitioner and as supervising physician I was immediately available for consultation/collaboration.   Forbes Cellar, MD 02/27/12 2310

## 2012-02-27 NOTE — ED Notes (Addendum)
Per EMS -Pt was walking to bus, wearing flipflops and the toe caught, rolling underfoot. Pt has abrasion to end of right great toe, missing part of great toe- toenail. Pt also complains right ankle pain. Per EMS- no deformity noted to ankle.

## 2012-03-13 ENCOUNTER — Encounter (HOSPITAL_COMMUNITY): Payer: Self-pay | Admitting: *Deleted

## 2012-03-13 ENCOUNTER — Emergency Department (HOSPITAL_COMMUNITY)
Admission: EM | Admit: 2012-03-13 | Discharge: 2012-03-13 | Disposition: A | Discharge status: Home / Self Care | Payer: Medicaid - Out of State | Attending: Emergency Medicine | Admitting: Emergency Medicine

## 2012-03-13 DIAGNOSIS — K0889 Other specified disorders of teeth and supporting structures: Secondary | ICD-10-CM

## 2012-03-13 DIAGNOSIS — G35 Multiple sclerosis: Secondary | ICD-10-CM | POA: Insufficient documentation

## 2012-03-13 DIAGNOSIS — I10 Essential (primary) hypertension: Secondary | ICD-10-CM | POA: Insufficient documentation

## 2012-03-13 DIAGNOSIS — K0381 Cracked tooth: Secondary | ICD-10-CM | POA: Insufficient documentation

## 2012-03-13 DIAGNOSIS — E079 Disorder of thyroid, unspecified: Secondary | ICD-10-CM | POA: Insufficient documentation

## 2012-03-13 DIAGNOSIS — K5090 Crohn's disease, unspecified, without complications: Secondary | ICD-10-CM | POA: Insufficient documentation

## 2012-03-13 MED ORDER — OXYCODONE-ACETAMINOPHEN 5-325 MG PO TABS: 1.0000 | ORAL_TABLET | Freq: Four times a day (QID) | ORAL | Status: AC | PRN

## 2012-03-13 MED ORDER — OXYCODONE-ACETAMINOPHEN 5-325 MG PO TABS
1.0000 | ORAL_TABLET | Freq: Once | ORAL | Status: AC
  Administered 2012-03-13: 1 via ORAL
  Filled 2012-03-13: qty 1

## 2012-03-13 MED ORDER — OXYCODONE-ACETAMINOPHEN 5-325 MG PO TABS
ORAL_TABLET | ORAL | Status: AC
  Filled 2012-03-13: qty 1

## 2012-03-13 MED ORDER — LIDOCAINE VISCOUS 2 % MT SOLN
20.0000 mL | Freq: Once | OROMUCOSAL | Status: AC
  Administered 2012-03-13: 20 mL via OROMUCOSAL
  Filled 2012-03-13: qty 15

## 2012-03-13 NOTE — ED Notes (Signed)
Pt. Stated, Im having some dental problems on the top and bottom

## 2012-03-13 NOTE — ED Provider Notes (Signed)
History    Scribed for Dr. Billey Chang, the patient was seen in room TR07C/TR07C. This chart was scribed by Katha Cabal.   CSN: [id number]  Arrival date & time 03/13/12  1251   First MD Initiated Contact with Patient 03/13/12 1342      Chief Complaint  Patient presents with  . Dental Pain    (Consider location/radiation/quality/duration/timing/severity/associated sxs/prior treatment) HPI Dr. Jeraldine Loots entered patient's room at 2:30 PM   Jenna Henderson is a 31 y.o. female who presents to the Emergency Department complaining of persistence of moderate left upper dental pain since yesterday.  Patient states she cracked tooth while eating yesterday.   Patient took Tylenol without relief. Symptoms are associated with nausea, jaw pain and headaches.  Symptoms are not associated with chest pain, vomiting, abdominal pain, numbness, fever, chills and confusion. Patient with history of multiple sclerosis that is well managed.      Past Medical History  Diagnosis Date  . MS (multiple sclerosis)   . Thyroid disease   . Hypertension   . Peptic ulcer   . Degenerative disk disease   . Degenerative disk disease   . Crohn's disease     Past Surgical History  Procedure Date  . Cholecystectomy   . Cesarean section     No family history on file.  History  Substance Use Topics  . Smoking status: Never Smoker   . Smokeless tobacco: Not on file  . Alcohol Use: No    OB History    Grav Para Term Preterm Abortions TAB SAB Ect Mult Living            3      Review of Systems  All other systems reviewed and are negative.   Remaining review of systems negative except as noted in the HPI.   Allergies  Prednisone; Aspirin; Nsaids; Penicillins; Ultram; Imitrex; Ketorolac tromethamine; and Other  Home Medications   Current Outpatient Rx  Name Route Sig Dispense Refill  . DICYCLOMINE HCL 10 MG PO CAPS Oral Take 10 mg by mouth 4 (four) times daily -  before meals and at bedtime.      . INTERFERON BETA-1B 0.3 MG [place] SOLR Subcutaneous Inject 0.25 mg into the skin every other day.    Marland Kitchen OMEPRAZOLE 20 MG PO CPDR Oral Take 20 mg by mouth daily.    . QUETIAPINE FUMARATE ER 400 MG PO TB24 Oral Take 400 mg by mouth at bedtime.    . FLUOXETINE HCL 10 MG PO CAPS Oral Take 10 mg by mouth daily.    . OXYCODONE-ACETAMINOPHEN 5-325 MG PO TABS Oral Take 1 tablet by mouth every 6 (six) hours as needed for pain. 15 tablet 0    BP 112/74  Pulse 84  Temp 97.2 F (36.2 C) (Oral)  Resp 16  Ht 5\' 5"  (1.651 m)  Wt 263 lb (119.296 kg)  BMI 43.77 kg/m2  SpO2 98%  LMP 02/22/2012  Physical Exam  Nursing note and vitals reviewed. Constitutional: She is oriented to person, place, and time. She appears well-developed and well-nourished. No distress.  HENT:  Head: Normocephalic and atraumatic.       Cracked central incision on top left, trivial lip laceration on top left, laceration near central canine, tenderness to palpation to TMJ, no TMJ swelling appreciated   Eyes: Conjunctivae and EOM are normal.  Cardiovascular: Normal rate and regular rhythm.   Pulmonary/Chest: Effort normal and breath sounds normal. No stridor. No respiratory distress.  Abdominal: She exhibits  no distension.  Musculoskeletal: She exhibits no edema.  Neurological: She is alert and oriented to person, place, and time. A cranial nerve deficit is present.  Skin: Skin is warm and dry.  Psychiatric: She has a normal mood and affect.    ED Course  Procedures (including critical care time)    COORDINATION OF CARE: 2:35 PM  Physical exam complete.   2:42 PM  Oxycodone/acteomenophen ordered.  Lidocaine mouth solution ordered.   2:45 PM  Plan to discharge patient home with dentist referral and pain medications.  Patient agrees with plan.       LABS / RADIOLOGY:   Labs Reviewed - No data to display No results found.       MDM  This generally well appearing F (w MS) now p/w dental pain.  She has a Grade I  fracture of a central incisor, with an associated minor laceration in the lower gum, but is in no distress, w no e/o systemic infection, no other notable complaints/ PE findings.  The patient was d/c w analgesics, dental F/U.    MEDICATIONS GIVEN IN THE E.D. Scheduled Meds:    . lidocaine  20 mL Mouth/Throat Once  . oxyCODONE-acetaminophen  1 tablet Oral Once  . oxyCODONE-acetaminophen       Continuous Infusions:      IMPRESSION: 1. Pain, dental      NEW MEDICATIONS: New Prescriptions   OXYCODONE-ACETAMINOPHEN (PERCOCET/ROXICET) 5-325 MG PER TABLET    Take 1 tablet by mouth every 6 (six) hours as needed for pain.   I personally performed the services described in this documentation, which was scribed in my presence. The recorded information has been reviewed and considered.          Gerhard Munch, MD 03/14/12 [phone]

## 2012-03-13 NOTE — ED Notes (Signed)
Patient with onset of tooth ache in her left upper lower right sides on yesterday

## 2012-03-23 ENCOUNTER — Encounter (HOSPITAL_COMMUNITY): Payer: Self-pay | Admitting: Adult Health

## 2012-03-23 ENCOUNTER — Emergency Department (HOSPITAL_COMMUNITY)
Admission: EM | Admit: 2012-03-23 | Discharge: 2012-03-23 | Disposition: A | Discharge status: Home / Self Care | Payer: Medicaid - Out of State | Attending: Emergency Medicine | Admitting: Emergency Medicine

## 2012-03-23 DIAGNOSIS — IMO0002 Reserved for concepts with insufficient information to code with codable children: Secondary | ICD-10-CM | POA: Insufficient documentation

## 2012-03-23 DIAGNOSIS — I10 Essential (primary) hypertension: Secondary | ICD-10-CM | POA: Insufficient documentation

## 2012-03-23 DIAGNOSIS — K029 Dental caries, unspecified: Secondary | ICD-10-CM

## 2012-03-23 DIAGNOSIS — E079 Disorder of thyroid, unspecified: Secondary | ICD-10-CM | POA: Insufficient documentation

## 2012-03-23 DIAGNOSIS — G35 Multiple sclerosis: Secondary | ICD-10-CM | POA: Insufficient documentation

## 2012-03-23 DIAGNOSIS — K5090 Crohn's disease, unspecified, without complications: Secondary | ICD-10-CM | POA: Insufficient documentation

## 2012-03-23 MED ORDER — OXYCODONE-ACETAMINOPHEN 5-325 MG PO TABS
1.0000 | ORAL_TABLET | Freq: Once | ORAL | Status: AC
  Administered 2012-03-23: 1 via ORAL
  Filled 2012-03-23: qty 1

## 2012-03-23 MED ORDER — CLINDAMYCIN HCL 150 MG PO CAPS: 150.0000 mg | ORAL_CAPSULE | Freq: Three times a day (TID) | ORAL | Status: DC

## 2012-03-23 MED ORDER — CLINDAMYCIN HCL 150 MG PO CAPS
150.0000 mg | ORAL_CAPSULE | Freq: Once | ORAL | Status: AC
  Administered 2012-03-23: 150 mg via ORAL
  Filled 2012-03-23: qty 1

## 2012-03-23 NOTE — ED Provider Notes (Signed)
History  This chart was scribed for Jenna Booze, MD by Shari Heritage. The patient was seen in room TR09C/TR09C. Patient's care was started at 1646.   CSN: [id number]  Arrival date & time 03/23/12  1646   First MD Initiated Contact with Patient 03/23/12 1813      Chief Complaint  Patient presents with  . Dental Pain    The history is provided by the patient. No language interpreter was used.   Jenna Henderson is a 31 y.o. female with a history of MS who presents to the Emergency Department complaining of severe, left lower dental pain onset 1 day ago. Associated symptoms include nausea and vomiting. Patient rates her dental pain as 9/10. She states that she hasn't had problems with this tooth before. She has been having trouble eating cold and hot foods. Patient says that she has been taking Acetinomphen 500 mg with no relief. Patient states that she hasn't been seeing a dentist regularly. Her visit was when she had her wisdom teeth removed last February. Patient also medical history of HTN, peptic ulcer and thyroid disease. Her surgical history includes cholecystectomy and C-section.   Past Medical History  Diagnosis Date  . MS (multiple sclerosis)   . Thyroid disease   . Hypertension   . Peptic ulcer   . Degenerative disk disease   . Degenerative disk disease   . Crohn's disease     Past Surgical History  Procedure Date  . Cholecystectomy   . Cesarean section     History reviewed. No pertinent family history.  History  Substance Use Topics  . Smoking status: Never Smoker   . Smokeless tobacco: Not on file  . Alcohol Use: No    OB History    Grav Para Term Preterm Abortions TAB SAB Ect Mult Living            3      Review of Systems  HENT: Positive for dental problem.   Gastrointestinal: Positive for nausea and vomiting.  All other systems reviewed and are negative.    Allergies  Prednisone; Aspirin; Nsaids; Penicillins; Ultram; Imitrex; Ketorolac tromethamine;  and Other  Home Medications   Current Outpatient Rx  Name Route Sig Dispense Refill  . DICYCLOMINE HCL 10 MG PO CAPS Oral Take 10 mg by mouth 4 (four) times daily -  before meals and at bedtime.    . FLUOXETINE HCL 10 MG PO CAPS Oral Take 10 mg by mouth daily.    . INTERFERON BETA-1B 0.3 MG [place] SOLR Subcutaneous Inject 0.25 mg into the skin every other day.    Marland Kitchen OMEPRAZOLE 20 MG PO CPDR Oral Take 20 mg by mouth daily.    . OXYCODONE-ACETAMINOPHEN 5-325 MG PO TABS Oral Take 1 tablet by mouth every 6 (six) hours as needed for pain. 15 tablet 0  . QUETIAPINE FUMARATE ER 400 MG PO TB24 Oral Take 400 mg by mouth at bedtime.      BP 131/63  Pulse 101  Temp 98.5 F (36.9 C) (Oral)  Resp 16  SpO2 100%  LMP 02/22/2012  Physical Exam  Constitutional: She is oriented to person, place, and time. She appears well-developed and well-nourished.       Appears to be in pain.  HENT:  Head: Normocephalic and atraumatic.  Mouth/Throat: Dental caries present.       Tooth #23 has dental caries at the gingival line. Tender to palpation. Small ulceration seen on the gingiva.  Neurological: She is alert  and oriented to person, place, and time.    ED Course  Procedures (including critical care time) DIAGNOSTIC STUDIES: Oxygen Saturation is 100% on room air, normal by my interpretation.    COORDINATION OF CARE: 6:20pm- Patient informed of current plan for treatment and evaluation and agrees with plan at this time. Will refer patient to Dr. Russella Dar, the on call dentist. Will administer 1 tablet of Percocet 5-325 mg and Clindamycin 150 mg in the ED. Will discharge patient with prescriptions for clindamycin 150 mg to take 3 times daily.   No diagnosis found.    MDM  Dental caries with dental pain. Review of her prior records shows multiple ED visits for painful conditions including multiple visits for dental pain. Patient was as well as x18 and dentist was and she stated was over a year ago. However,  review of records shows that she had told and ED physician assistant that she had had the extraction was done several months ago. There have been repeated mentions that she appeared to be a drug-seeking and, and multiple times, narcotic prescriptions were written and then withdrawn. Review of her records on [place] controlled substance reporting website shows a stretch of 10 days where she got 4 separate prescriptions for Percocet, although there are no records of prescriptions being filled more recently in June 28. Given her inconsistencies on questioning and history of apparent a drug-seeking behavior, she is not given any narcotic prescriptions. She's given a single dose of Percocet in the emergency department and she is sent home with prescription for Cleocin to help bring the dental infection under control and she is referred to the on-call dentist.      I personally performed the services described in this documentation, which was scribed in my presence. The recorded information has been reviewed and considered.   Jenna Booze, MD 03/23/12 2041

## 2012-03-23 NOTE — ED Notes (Signed)
C/o left lower jaw dental pain, ulcerated area noted to lower incisor associated with edema ear pain and head ache that began last night.

## 2012-03-25 ENCOUNTER — Emergency Department (HOSPITAL_COMMUNITY)
Admission: EM | Admit: 2012-03-25 | Discharge: 2012-03-25 | Disposition: A | Discharge status: Home / Self Care | Payer: Medicaid - Out of State | Attending: Emergency Medicine | Admitting: Emergency Medicine

## 2012-03-25 ENCOUNTER — Encounter (HOSPITAL_COMMUNITY): Payer: Self-pay | Admitting: Emergency Medicine

## 2012-03-25 DIAGNOSIS — Z8711 Personal history of peptic ulcer disease: Secondary | ICD-10-CM | POA: Insufficient documentation

## 2012-03-25 DIAGNOSIS — K5090 Crohn's disease, unspecified, without complications: Secondary | ICD-10-CM | POA: Insufficient documentation

## 2012-03-25 DIAGNOSIS — Z888 Allergy status to other drugs, medicaments and biological substances status: Secondary | ICD-10-CM | POA: Insufficient documentation

## 2012-03-25 DIAGNOSIS — Z880 Allergy status to penicillin: Secondary | ICD-10-CM | POA: Insufficient documentation

## 2012-03-25 DIAGNOSIS — K0889 Other specified disorders of teeth and supporting structures: Secondary | ICD-10-CM

## 2012-03-25 DIAGNOSIS — Z79899 Other long term (current) drug therapy: Secondary | ICD-10-CM | POA: Insufficient documentation

## 2012-03-25 DIAGNOSIS — K089 Disorder of teeth and supporting structures, unspecified: Secondary | ICD-10-CM | POA: Insufficient documentation

## 2012-03-25 DIAGNOSIS — I10 Essential (primary) hypertension: Secondary | ICD-10-CM | POA: Insufficient documentation

## 2012-03-25 DIAGNOSIS — E079 Disorder of thyroid, unspecified: Secondary | ICD-10-CM | POA: Insufficient documentation

## 2012-03-25 DIAGNOSIS — G35 Multiple sclerosis: Secondary | ICD-10-CM | POA: Insufficient documentation

## 2012-03-25 DIAGNOSIS — R1110 Vomiting, unspecified: Secondary | ICD-10-CM | POA: Insufficient documentation

## 2012-03-25 DIAGNOSIS — Z886 Allergy status to analgesic agent status: Secondary | ICD-10-CM | POA: Insufficient documentation

## 2012-03-25 MED ORDER — ONDANSETRON HCL 4 MG PO TABS: 4.0000 mg | ORAL_TABLET | Freq: Three times a day (TID) | ORAL | Status: DC | PRN

## 2012-03-25 MED ORDER — OXYCODONE-ACETAMINOPHEN 5-325 MG PO TABS
1.0000 | ORAL_TABLET | Freq: Once | ORAL | Status: AC
  Administered 2012-03-25: 1 via ORAL
  Filled 2012-03-25: qty 1

## 2012-03-25 MED ORDER — HYDROCODONE-ACETAMINOPHEN 5-325 MG PO TABS: 1.0000 | ORAL_TABLET | ORAL | Status: DC | PRN

## 2012-03-25 NOTE — ED Provider Notes (Signed)
History     CSN: [id number]  Arrival date & time 03/25/12  1559   First MD Initiated Contact with Patient 03/25/12 1804      Chief Complaint  Patient presents with  . Abscess    tooth  . Emesis  . Diarrhea    (Consider location/radiation/quality/duration/timing/severity/associated sxs/prior treatment) HPI Comments: Pt presents with left lower canine tooth pain, nausea, vomiting, and diarrhea. On Monday patient went to the dentist to have her tooth taken out, but the dentist noticed an abscess and placed her on clindamycin. The pain in her tooth has since then gotten worse and began to radiate up her left jaw. Pain is rated severity 10/10. She has taken ibuprofen and tylenol for pain with no relief. She has been taking her clindamycin as prescribed. She has since then been having nausea that began when she started taking the medication. Over the past two days ago she vomited 3 times and has been experiencing watery brown diarrhea. She believes that these symptoms are related to the antibiotic. Denies fever, headache, ear pain or loss of hearing, eye pain or discharge, sinus pressure, sore throat, swelling of her throat or tongue, or difficulty breathing or swallowing.  Patient is a 31 y.o. female presenting with abscess, vomiting, and diarrhea. The history is provided by the patient.  Abscess  Associated symptoms include diarrhea and vomiting. Pertinent negatives include no fever, no congestion and no sore throat.  Emesis  Associated symptoms include diarrhea. Pertinent negatives include no chills and no fever.  Diarrhea The primary symptoms include vomiting and diarrhea. Primary symptoms do not include fever.  The illness does not include chills.    Past Medical History  Diagnosis Date  . MS (multiple sclerosis)   . Thyroid disease   . Hypertension   . Peptic ulcer   . Degenerative disk disease   . Degenerative disk disease   . Crohn's disease     Past Surgical History    Procedure Date  . Cholecystectomy   . Cesarean section     No family history on file.  History  Substance Use Topics  . Smoking status: Never Smoker   . Smokeless tobacco: Not on file  . Alcohol Use: No    OB History    Grav Para Term Preterm Abortions TAB SAB Ect Mult Living            3      Review of Systems  Constitutional: Negative for fever and chills.  HENT: Positive for dental problem. Negative for ear pain, congestion, sore throat, drooling and trouble swallowing.   Respiratory: Negative for shortness of breath, wheezing and stridor.   Gastrointestinal: Positive for vomiting and diarrhea.    Allergies  Prednisone; Aspirin; Nsaids; Penicillins; Ultram; Imitrex; Ketorolac tromethamine; and Other  Home Medications   Current Outpatient Rx  Name Route Sig Dispense Refill  . CLINDAMYCIN HCL 150 MG PO CAPS Oral Take 150 mg by mouth 3 (three) times daily.    Marland Kitchen DICYCLOMINE HCL 10 MG PO CAPS Oral Take 10 mg by mouth 4 (four) times daily -  before meals and at bedtime.    . FLUOXETINE HCL 10 MG PO CAPS Oral Take 10 mg by mouth daily.    . INTERFERON BETA-1B 0.3 MG [place] SOLR Subcutaneous Inject 0.25 mg into the skin every other day.    Marland Kitchen OMEPRAZOLE 20 MG PO CPDR Oral Take 20 mg by mouth daily.    . QUETIAPINE FUMARATE ER 400 MG PO  TB24 Oral Take 400 mg by mouth at bedtime.      BP 131/83  Pulse 87  Temp 98.8 F (37.1 C) (Oral)  Resp 20  SpO2 100%  LMP 03/08/2012  Physical Exam  Nursing note and vitals reviewed. Constitutional: She appears well-developed and well-nourished. No distress.  HENT:  Head: Normocephalic and atraumatic.  Mouth/Throat: Oropharynx is clear and moist. No oropharyngeal exudate.    Eyes: Conjunctivae are normal.  Neck: Neck supple.  Cardiovascular: Normal rate and regular rhythm.   Pulmonary/Chest: Effort normal and breath sounds normal. No respiratory distress. She has no wheezes. She has no rales.  Abdominal: Soft. She exhibits no  distension. There is no tenderness. There is no rebound and no guarding.  Neurological: She is alert.  Skin: She is not diaphoretic.    ED Course  Procedures (including critical care time)  Labs Reviewed - No data to display No results found.   1. Pain, dental       MDM  Pt with known dental abscess, seen by dentist 5 days ago and started on clindamycin.  Sent home without pain medications from dentist, has been taking tylenol and ibuprofen without relief.  Developed N/V/D after taking antibiotic.  Pt is not dehydrated.  Abdomen is nontender.  Pt to see dentist on Monday for follow up.  Afebrile, nontoxic, airway is widely patent.  Abscess does not appear to be worsening.  Pt d/c home with norco for pain, zofran for nausea, pt to take imodium or pepto bismol as needed for diarrhea.  Discussed diagnosis and plan with patient.  Pt given return precautions.  Pt verbalizes understanding and agrees with plan.          [place] 03/25/12 1951

## 2012-03-25 NOTE — ED Notes (Signed)
Pt presenting to ed with c/o abscess to her lower left tooth pt states she was seen here x 2 days ago and received antibiotics but she is still having pain, swelling, nausea and diarrhea. Pt states she was instructed to present to ed by the dentist

## 2012-03-25 NOTE — ED Provider Notes (Signed)
Medical screening examination/treatment/procedure(s) were performed by non-physician practitioner and as supervising physician I was immediately available for consultation/collaboration.   Dulcey Riederer, MD 03/25/12 2315 

## 2012-03-31 ENCOUNTER — Encounter (HOSPITAL_COMMUNITY): Payer: Self-pay

## 2012-03-31 ENCOUNTER — Emergency Department (HOSPITAL_COMMUNITY)
Admission: EM | Admit: 2012-03-31 | Discharge: 2012-03-31 | Disposition: A | Discharge status: Home / Self Care | Payer: Medicaid - Out of State | Attending: Emergency Medicine | Admitting: Emergency Medicine

## 2012-03-31 DIAGNOSIS — K089 Disorder of teeth and supporting structures, unspecified: Secondary | ICD-10-CM | POA: Insufficient documentation

## 2012-03-31 DIAGNOSIS — R51 Headache: Secondary | ICD-10-CM | POA: Insufficient documentation

## 2012-03-31 DIAGNOSIS — K0889 Other specified disorders of teeth and supporting structures: Secondary | ICD-10-CM

## 2012-03-31 MED ORDER — HYDROCODONE-ACETAMINOPHEN 5-325 MG PO TABS: 1.0000 | ORAL_TABLET | Freq: Four times a day (QID) | ORAL | Status: AC | PRN

## 2012-03-31 NOTE — ED Notes (Signed)
Pt presents with L lower tooth pain, had extraction done on Monday.  Pt reports frontal headache that began today, reports nausea and photophobia which are new symptoms of her migraines.  +blurred vision.  Tylenol taken gave no relief.

## 2012-04-03 NOTE — ED Provider Notes (Signed)
History     CSN: [id number]  Arrival date & time 03/31/12  1430   First MD Initiated Contact with Patient 03/31/12 1547      Chief Complaint  Patient presents with  . Headache    (Consider location/radiation/quality/duration/timing/severity/associated sxs/prior treatment) Patient is a 31 y.o. female presenting with headaches. The history is provided by the patient.  Headache  Associated symptoms include nausea. Pertinent negatives include no fever, no shortness of breath and no vomiting.  Patient presents with lower tooth pain, had extraction on Monday by oral surgery, and out of pain meds. The tooth pain is causing frontal headache, nausea, and some blurred vision, but the main problem is the tooth pain. Rated 9/10 sharp and throbbing. No fever, no vomiting.  Past Medical History  Diagnosis Date  . MS (multiple sclerosis)   . Thyroid disease   . Hypertension   . Peptic ulcer   . Degenerative disk disease   . Degenerative disk disease   . Crohn's disease     Past Surgical History  Procedure Date  . Cholecystectomy   . Cesarean section     No family history on file.  History  Substance Use Topics  . Smoking status: Never Smoker   . Smokeless tobacco: Not on file  . Alcohol Use: No    OB History    Grav Para Term Preterm Abortions TAB SAB Ect Mult Living            3      Review of Systems  Constitutional: Negative for fever.  HENT: Negative for sore throat and neck pain.   Eyes: Positive for photophobia and visual disturbance.  Respiratory: Negative for shortness of breath.   Cardiovascular: Negative for chest pain.  Gastrointestinal: Positive for nausea. Negative for vomiting and abdominal pain.  Genitourinary: Negative for dysuria.  Musculoskeletal: Negative for back pain.  Skin: Negative for rash.  Neurological: Positive for headaches.  Hematological: Does not bruise/bleed easily.    Allergies  Prednisone; Aspirin; Nsaids; Penicillins; Ultram;  Imitrex; Ketorolac tromethamine; and Other  Home Medications   Current Outpatient Rx  Name Route Sig Dispense Refill  . DICYCLOMINE HCL 10 MG PO CAPS Oral Take 10 mg by mouth 4 (four) times daily.    Marland Kitchen FERROUS SULFATE 325 (65 FE) MG PO TABS Oral Take 325 mg by mouth daily with breakfast.    . FLUOXETINE HCL 10 MG PO CAPS Oral Take 10 mg by mouth daily.    . INTERFERON BETA-1B 0.3 MG [place] SOLR Subcutaneous Inject 0.25 mg into the skin every other day.    Marland Kitchen OMEPRAZOLE 20 MG PO CPDR Oral Take 20 mg by mouth daily.    Marland Kitchen ONDANSETRON HCL 4 MG PO TABS Oral Take 4 mg by mouth every 8 (eight) hours as needed. For nausea.    Marland Kitchen QUETIAPINE FUMARATE ER 400 MG PO TB24 Oral Take 400 mg by mouth at bedtime.    Marland Kitchen HYDROCODONE-ACETAMINOPHEN 5-325 MG PO TABS Oral Take 1-2 tablets by mouth every 6 (six) hours as needed for pain. 20 tablet 0    BP 118/58  Pulse 81  Temp 98.4 F (36.9 C) (Oral)  Resp 18  Ht 5\' 5"  (1.651 m)  Wt 268 lb (121.564 kg)  BMI 44.60 kg/m2  SpO2 100%  LMP 03/13/2012  Physical Exam  Nursing note and vitals reviewed. Constitutional: She is oriented to person, place, and time. She appears well-developed and well-nourished. No distress.  HENT:  Head: Normocephalic and atraumatic.  Mouth/Throat: Oropharynx is clear and moist.       Left lower molar extraction, socket appears healthy, no gum swelling no discharge no bleeding.     Eyes: Conjunctivae and EOM are normal. Pupils are equal, round, and reactive to light.  Neck: Normal range of motion. Neck supple.  Cardiovascular: Normal rate, regular rhythm and normal heart sounds.   No murmur heard. Pulmonary/Chest: Effort normal and breath sounds normal.  Abdominal: Soft. Bowel sounds are normal. There is no tenderness.  Musculoskeletal: Normal range of motion.  Lymphadenopathy:    She has no cervical adenopathy.  Neurological: She is alert and oriented to person, place, and time. No cranial nerve deficit. She exhibits normal  muscle tone. Coordination normal.  Skin: Skin is warm. No rash noted.    ED Course  Procedures (including critical care time)  Labs Reviewed - No data to display No results found.   1. Toothache       MDM  Main problem is pain at site of extraction and out of pain meds. Patient is not toxic NAD. No significant or severe headache.             Shelda Jakes, MD 04/03/12 [phone]

## 2012-04-14 ENCOUNTER — Emergency Department (HOSPITAL_COMMUNITY): Payer: Medicaid - Out of State

## 2012-04-14 ENCOUNTER — Encounter (HOSPITAL_COMMUNITY): Payer: Self-pay | Admitting: Adult Health

## 2012-04-14 ENCOUNTER — Encounter (HOSPITAL_COMMUNITY): Payer: Self-pay | Admitting: *Deleted

## 2012-04-14 ENCOUNTER — Emergency Department (HOSPITAL_COMMUNITY)
Admission: EM | Admit: 2012-04-14 | Discharge: 2012-04-14 | Disposition: A | Discharge status: Home / Self Care | Payer: Medicaid - Out of State | Attending: Emergency Medicine | Admitting: Emergency Medicine

## 2012-04-14 ENCOUNTER — Emergency Department (HOSPITAL_COMMUNITY)
Admission: EM | Admit: 2012-04-14 | Discharge: 2012-04-14 | Disposition: A | Discharge status: Home / Self Care | Payer: Medicaid - Out of State | Source: Home / Self Care | Attending: Emergency Medicine | Admitting: Emergency Medicine

## 2012-04-14 DIAGNOSIS — I10 Essential (primary) hypertension: Secondary | ICD-10-CM | POA: Insufficient documentation

## 2012-04-14 DIAGNOSIS — W19XXXA Unspecified fall, initial encounter: Secondary | ICD-10-CM

## 2012-04-14 DIAGNOSIS — M542 Cervicalgia: Secondary | ICD-10-CM

## 2012-04-14 DIAGNOSIS — G35 Multiple sclerosis: Secondary | ICD-10-CM | POA: Insufficient documentation

## 2012-04-14 DIAGNOSIS — M25559 Pain in unspecified hip: Secondary | ICD-10-CM | POA: Insufficient documentation

## 2012-04-14 DIAGNOSIS — M549 Dorsalgia, unspecified: Secondary | ICD-10-CM

## 2012-04-14 DIAGNOSIS — W108XXA Fall (on) (from) other stairs and steps, initial encounter: Secondary | ICD-10-CM | POA: Insufficient documentation

## 2012-04-14 DIAGNOSIS — K279 Peptic ulcer, site unspecified, unspecified as acute or chronic, without hemorrhage or perforation: Secondary | ICD-10-CM | POA: Insufficient documentation

## 2012-04-14 DIAGNOSIS — K5090 Crohn's disease, unspecified, without complications: Secondary | ICD-10-CM | POA: Insufficient documentation

## 2012-04-14 HISTORY — DX: Migraine, unspecified, not intractable, without status migrainosus: G43.909

## 2012-04-14 MED ORDER — FENTANYL CITRATE 0.05 MG/ML IJ SOLN
50.0000 ug | Freq: Once | INTRAMUSCULAR | Status: AC
  Administered 2012-04-14: 50 ug via INTRAVENOUS
  Filled 2012-04-14: qty 2

## 2012-04-14 MED ORDER — MORPHINE SULFATE 4 MG/ML IJ SOLN
4.0000 mg | Freq: Once | INTRAMUSCULAR | Status: DC
  Filled 2012-04-14: qty 1

## 2012-04-14 MED ORDER — MORPHINE SULFATE 4 MG/ML IJ SOLN
4.0000 mg | Freq: Once | INTRAMUSCULAR | Status: AC
  Administered 2012-04-14: 4 mg via INTRAVENOUS

## 2012-04-14 NOTE — ED Notes (Signed)
Patient is alert and oriented x3.  She was given DC instructions and follow up visit instructions.  Patient gave verbal understanding. She was DC ambulatory under his own power to home.  V/S stable.  He was not showing any signs of distress on DC 

## 2012-04-14 NOTE — ED Notes (Signed)
EMS called to home.  Found patient sitting at bottom of stairs after a fall. Patient states that she fell and slid down the stairs on her buttocks She rates her pain 10 of 10 constant

## 2012-04-14 NOTE — ED Notes (Signed)
Advised of the wait time 

## 2012-04-14 NOTE — ED Notes (Signed)
Called once w/o answer

## 2012-04-14 NOTE — ED Notes (Signed)
Called patient x 3 with no answer

## 2012-04-14 NOTE — ED Provider Notes (Signed)
History     CSN: [id number]  Arrival date & time 04/14/12  1952   First MD Initiated Contact with Patient 04/14/12 2037      Chief Complaint  Patient presents with  . Fall    Slid on buttocks down stairs    (Consider location/radiation/quality/duration/timing/severity/associated sxs/prior treatment) HPI Pt presents with pain in back, left hip and neck after fall down several steps.  Pt states she tripped over her shoe as she was rushing out of the house causing her to fall.  She denies striking her head, no LOC.  Pt was BIBEMS on long spine board and c-collar immobilization.  No vomiting, no seizure activity.  Has not tried to ambulate since fall. Has had no other treatments.  Pain described as soreness, worse with movement. There are no other associated systemic symptoms, there are no other alleviating or modifying factors.   Past Medical History  Diagnosis Date  . MS (multiple sclerosis)   . Thyroid disease   . Hypertension   . Peptic ulcer   . Degenerative disk disease   . Degenerative disk disease   . Crohn's disease   . Migraine     Past Surgical History  Procedure Date  . Cholecystectomy   . Cesarean section     History reviewed. No pertinent family history.  History  Substance Use Topics  . Smoking status: Never Smoker   . Smokeless tobacco: Not on file  . Alcohol Use: No    OB History    Grav Para Term Preterm Abortions TAB SAB Ect Mult Living            3      Review of Systems ROS reviewed and all otherwise negative except for mentioned in HPI  Allergies  Prednisone; Aspirin; Nsaids; Penicillins; Ultram; Imitrex; Ketorolac tromethamine; and Other  Home Medications   Current Outpatient Rx  Name Route Sig Dispense Refill  . DICYCLOMINE HCL 10 MG PO CAPS Oral Take 10 mg by mouth 4 (four) times daily.    Marland Kitchen FERROUS SULFATE 325 (65 FE) MG PO TABS Oral Take 325 mg by mouth daily with breakfast.    . FLUOXETINE HCL 10 MG PO CAPS Oral Take 10 mg by  mouth daily.    . INTERFERON BETA-1B 0.3 MG [place] SOLR Subcutaneous Inject 0.25 mg into the skin every other day.     Marland Kitchen OMEPRAZOLE 20 MG PO CPDR Oral Take 20 mg by mouth daily.    . QUETIAPINE FUMARATE ER 400 MG PO TB24 Oral Take 800 mg by mouth at bedtime.       BP 141/91  Pulse 91  Temp 98.6 F (37 C) (Oral)  Resp 18  SpO2 100%  LMP 04/11/2012 Vitals reviewed Physical Exam Physical Examination: General appearance - alert, well appearing, and in no distress Mental status - alert, oriented to person, place, and time Eyes - pupils equal and reactive, extraocular eye movements intact Mouth - mucous membranes moist, pharynx normal without lesions Neck - c-collar in place, ttp in midline over lower cspine Chest - clear to auscultation, no wheezes, rales or rhonchi, symmetric air entry, no chest wall tenderness Heart - normal rate, regular rhythm, normal S1, S2, no murmurs, rubs, clicks or gallops Abdomen - soft, nontender, nondistended, no masses or organomegaly Back exam - ttp diffusely in multiple areas over back including midline t and l tenderness, no CVA tenderness Neurological - alert, oriented, normal speech, strength 5/5 in extremities x 4, sensation intact distally Musculoskeletal -  no joint tenderness, deformity or swelling except pain over lateral left hip and some pain with left ROM Extremities - peripheral pulses normal, no pedal edema, no clubbing or cyanosis Skin - normal coloration and turgor, no rashes  ED Course  Procedures (including critical care time)  Labs Reviewed - No data to display No results found.   1. Fall   2. Back pain   3. Neck pain       MDM  Pt with pain in neck/back/left hip after fall down stairs.  Xray images reviewed by me as well, no acute abnormalities.  c-collar cleared after ct scan.  Pt given pain meds in ED, discharged with strict return precautions.   She is agreeable with this plan.         Ethelda Chick, MD 04/18/12 [phone]

## 2012-04-14 NOTE — ED Notes (Signed)
C/o lower left dental pain. Had sx last Tuesday to have molar cut out and has appoint on Monday to remove another tooth. Pain is a 10/10 today.

## 2012-05-03 ENCOUNTER — Encounter (HOSPITAL_COMMUNITY): Payer: Self-pay | Admitting: *Deleted

## 2012-05-03 ENCOUNTER — Emergency Department (HOSPITAL_COMMUNITY)
Admission: EM | Admit: 2012-05-03 | Discharge: 2012-05-03 | Disposition: A | Discharge status: Home / Self Care | Payer: Medicaid - Out of State | Attending: Emergency Medicine | Admitting: Emergency Medicine

## 2012-05-03 DIAGNOSIS — Z9109 Other allergy status, other than to drugs and biological substances: Secondary | ICD-10-CM | POA: Insufficient documentation

## 2012-05-03 DIAGNOSIS — G35 Multiple sclerosis: Secondary | ICD-10-CM | POA: Insufficient documentation

## 2012-05-03 DIAGNOSIS — Z880 Allergy status to penicillin: Secondary | ICD-10-CM | POA: Insufficient documentation

## 2012-05-03 DIAGNOSIS — M549 Dorsalgia, unspecified: Secondary | ICD-10-CM | POA: Insufficient documentation

## 2012-05-03 DIAGNOSIS — Z881 Allergy status to other antibiotic agents status: Secondary | ICD-10-CM | POA: Insufficient documentation

## 2012-05-03 DIAGNOSIS — I10 Essential (primary) hypertension: Secondary | ICD-10-CM | POA: Insufficient documentation

## 2012-05-03 DIAGNOSIS — Z888 Allergy status to other drugs, medicaments and biological substances status: Secondary | ICD-10-CM | POA: Insufficient documentation

## 2012-05-03 LAB — URINALYSIS, ROUTINE W REFLEX MICROSCOPIC
Bilirubin Urine: NEGATIVE
Hgb urine dipstick: NEGATIVE
Protein, ur: NEGATIVE mg/dL
Urobilinogen, UA: 0.2 mg/dL (ref 0.0–1.0)

## 2012-05-03 MED ORDER — OXYCODONE-ACETAMINOPHEN 5-325 MG PO TABS: 1.0000 | ORAL_TABLET | ORAL | Status: DC | PRN

## 2012-05-03 MED ORDER — METHOCARBAMOL 500 MG PO TABS: 500.0000 mg | ORAL_TABLET | Freq: Two times a day (BID) | ORAL | Status: DC | PRN

## 2012-05-03 MED ORDER — OXYCODONE-ACETAMINOPHEN 5-325 MG PO TABS
2.0000 | ORAL_TABLET | Freq: Once | ORAL | Status: AC
  Administered 2012-05-03: 2 via ORAL
  Filled 2012-05-03: qty 2

## 2012-05-03 NOTE — ED Provider Notes (Signed)
History     CSN: [id number]  Arrival date & time 05/03/12  0128   First MD Initiated Contact with Patient 05/03/12 0224      Chief Complaint  Patient presents with  . Back Pain    (Consider location/radiation/quality/duration/timing/severity/associated sxs/prior treatment) HPI Comments: 31 year old female with a history of multiple sclerosis, hypertension, frequent visits for pain complaints and some back pain who presents with a complaint of right-sided mid back pain. She states this started approximately 2 days ago while she was washing dishes at the kitchen sink. This was a sharp and shooting pain, it has been constant, worse with movement such as rotation at the hips or flexion at the hips and better when she lays flat. She denies any pathologic red flags for back pain including no fevers, IV drug use, cancer, incontinence or retention of urine or stool, weakness or numbness of the lower extremities. She states that the pain radiates into her right lateral hip and the right groin. There has been no dysuria fever chills nausea or vomiting.  Patient is a 31 y.o. female presenting with back pain. The history is provided by the patient and medical records.  Back Pain     Past Medical History  Diagnosis Date  . MS (multiple sclerosis)   . Thyroid disease   . Hypertension   . Peptic ulcer   . Degenerative disk disease   . Degenerative disk disease   . Crohn's disease   . Migraine     Past Surgical History  Procedure Date  . Cholecystectomy   . Cesarean section     History reviewed. No pertinent family history.  History  Substance Use Topics  . Smoking status: Never Smoker   . Smokeless tobacco: Not on file  . Alcohol Use: No    OB History    Grav Para Term Preterm Abortions TAB SAB Ect Mult Living            3      Review of Systems  Musculoskeletal: Positive for back pain.  All other systems reviewed and are negative.    Allergies  Prednisone; Aspirin;  Nsaids; Penicillins; Ultram; Imitrex; Ketorolac tromethamine; and Other  Home Medications   Current Outpatient Rx  Name Route Sig Dispense Refill  . DICYCLOMINE HCL 10 MG PO CAPS Oral Take 10 mg by mouth 4 (four) times daily.    Marland Kitchen FERROUS SULFATE 325 (65 FE) MG PO TABS Oral Take 325 mg by mouth daily with breakfast.    . FLUOXETINE HCL 10 MG PO CAPS Oral Take 10 mg by mouth daily.    . INTERFERON BETA-1B 0.3 MG [place] SOLR Subcutaneous Inject 0.25 mg into the skin every other day.     . METHOCARBAMOL 500 MG PO TABS Oral Take 1 tablet (500 mg total) by mouth 2 (two) times daily as needed. 20 tablet 0  . OMEPRAZOLE 20 MG PO CPDR Oral Take 20 mg by mouth daily.    . OXYCODONE-ACETAMINOPHEN 5-325 MG PO TABS Oral Take 1 tablet by mouth every 4 (four) hours as needed for pain. 20 tablet 0  . QUETIAPINE FUMARATE ER 400 MG PO TB24 Oral Take 800 mg by mouth at bedtime.       BP 148/96  Pulse 99  Temp 98.2 F (36.8 C) (Oral)  Resp 16  Ht 5' (1.524 m)  Wt 273 lb (123.832 kg)  BMI 53.32 kg/m2  SpO2 100%  LMP 04/11/2012  Physical Exam  Nursing note and vitals  reviewed. Constitutional: She appears well-developed and well-nourished. No distress.  HENT:  Head: Normocephalic and atraumatic.  Mouth/Throat: Oropharynx is clear and moist. No oropharyngeal exudate.  Eyes: Conjunctivae normal and EOM are normal. Pupils are equal, round, and reactive to light. Right eye exhibits no discharge. Left eye exhibits no discharge. No scleral icterus.  Neck: Normal range of motion. Neck supple. No JVD present. No thyromegaly present.  Cardiovascular: Normal rate, regular rhythm, normal heart sounds and intact distal pulses.  Exam reveals no gallop and no friction rub.   No murmur heard. Pulmonary/Chest: Effort normal and breath sounds normal. No respiratory distress. She has no wheezes. She has no rales.  Abdominal: Soft. Bowel sounds are normal. She exhibits no distension and no mass. There is no tenderness.    Musculoskeletal: Normal range of motion. She exhibits tenderness ( focal tenderness to palpation in the right paraspinal muscles, the right lateral chest wall and the right anterior chest wall. This is mild, there is no crepitance or changes in the skin. There is also tenderness into the right buttock.). She exhibits no edema.  Lymphadenopathy:    She has no cervical adenopathy.  Neurological: She is alert. Coordination normal.       Normal strength and sensation of the bilateral lower extremities  Skin: Skin is warm and dry. No rash noted. No erythema.  Psychiatric: She has a normal mood and affect. Her behavior is normal.    ED Course  Procedures (including critical care time)  Labs Reviewed  URINALYSIS, ROUTINE W REFLEX MICROSCOPIC - Abnormal; Notable for the following:    Specific Gravity, Urine >1.030 (*)     All other components within normal limits   No results found.   1. Back pain       MDM  The patient is morbidly obese and has chronic pain complaints, she does have tenderness to exam, suspect some muscle strain and spasm, Robaxin and Percocet for home, patient has multiple allergies. She appears stable for discharge and has normal vital signs. She has been informed of the indications for return.        Vida Roller, MD 05/03/12 [phone]

## 2012-05-03 NOTE — ED Notes (Signed)
Pt discharged. Pt stable at time of discharge. Medications reviewed pt has no questions regarding discharge at this time. Pt voiced understanding of discharge instructions.  

## 2012-05-03 NOTE — ED Notes (Signed)
Pt reporting pain in mid lower back x2 days.  Reports occasionally pain will shoot across right hip and into right leg.  Pt reports pain got worse about 11 pm when she felt sharp pain around right hip into groin area.  Also reporting occasional nausea.  Denies fever. Denies problems with urination at this time.

## 2012-05-05 ENCOUNTER — Emergency Department (HOSPITAL_COMMUNITY)
Admission: EM | Admit: 2012-05-05 | Discharge: 2012-05-05 | Disposition: A | Discharge status: Home / Self Care | Payer: Medicaid - Out of State | Attending: Emergency Medicine | Admitting: Emergency Medicine

## 2012-05-05 ENCOUNTER — Encounter (HOSPITAL_COMMUNITY): Payer: Self-pay | Admitting: *Deleted

## 2012-05-05 DIAGNOSIS — M51379 Other intervertebral disc degeneration, lumbosacral region without mention of lumbar back pain or lower extremity pain: Secondary | ICD-10-CM | POA: Insufficient documentation

## 2012-05-05 DIAGNOSIS — I10 Essential (primary) hypertension: Secondary | ICD-10-CM | POA: Insufficient documentation

## 2012-05-05 DIAGNOSIS — G8929 Other chronic pain: Secondary | ICD-10-CM

## 2012-05-05 DIAGNOSIS — M545 Low back pain, unspecified: Secondary | ICD-10-CM | POA: Insufficient documentation

## 2012-05-05 DIAGNOSIS — K5090 Crohn's disease, unspecified, without complications: Secondary | ICD-10-CM | POA: Insufficient documentation

## 2012-05-05 DIAGNOSIS — Z79899 Other long term (current) drug therapy: Secondary | ICD-10-CM | POA: Insufficient documentation

## 2012-05-05 DIAGNOSIS — E0789 Other specified disorders of thyroid: Secondary | ICD-10-CM | POA: Insufficient documentation

## 2012-05-05 DIAGNOSIS — M5137 Other intervertebral disc degeneration, lumbosacral region: Secondary | ICD-10-CM | POA: Insufficient documentation

## 2012-05-05 DIAGNOSIS — G35 Multiple sclerosis: Secondary | ICD-10-CM | POA: Insufficient documentation

## 2012-05-05 DIAGNOSIS — R52 Pain, unspecified: Secondary | ICD-10-CM | POA: Insufficient documentation

## 2012-05-05 DIAGNOSIS — Z8711 Personal history of peptic ulcer disease: Secondary | ICD-10-CM | POA: Insufficient documentation

## 2012-05-05 MED ORDER — OXYCODONE-ACETAMINOPHEN 5-325 MG PO TABS: ORAL_TABLET | ORAL | Status: DC

## 2012-05-05 MED ORDER — OXYCODONE-ACETAMINOPHEN 5-325 MG PO TABS
1.0000 | ORAL_TABLET | Freq: Once | ORAL | Status: AC
  Administered 2012-05-05: 1 via ORAL
  Filled 2012-05-05: qty 1

## 2012-05-05 NOTE — ED Notes (Signed)
Low back pain for 3 days with radiation down rt buttock and leg.

## 2012-05-05 NOTE — ED Notes (Signed)
Discharge instructions reviewed with pt, questions answered. Pt verbalized understanding.  

## 2012-05-05 NOTE — ED Provider Notes (Signed)
History     CSN: [id number]  Arrival date & time 05/05/12  2127   First MD Initiated Contact with Patient 05/05/12 2233      Chief Complaint  Patient presents with  . Back Pain    (Consider location/radiation/quality/duration/timing/severity/associated sxs/prior treatment) HPI Comments: Pt has know DDD.  She has an appt with dr. Gerilyn Pilgrim on 05-10-12.  She has chronic low back that became acutely worse while just standing in her kitchen 3 days ago.  The R leg radiation to the toes is new.    Patient is a 31 y.o. female presenting with back pain. The history is provided by the patient. No language interpreter was used.  Back Pain  This is a chronic problem. Episode onset: 3 days ago. The problem has not changed since onset.The pain is associated with no known injury. The pain is present in the lumbar spine. The pain is severe. Associated symptoms include leg pain. Pertinent negatives include no bowel incontinence, no perianal numbness, no bladder incontinence, no dysuria, no paresthesias, no paresis, no tingling and no weakness. She has tried nothing for the symptoms. Risk factors include obesity.    Past Medical History  Diagnosis Date  . MS (multiple sclerosis)   . Thyroid disease   . Hypertension   . Peptic ulcer   . Degenerative disk disease   . Degenerative disk disease   . Crohn's disease   . Migraine     Past Surgical History  Procedure Date  . Cholecystectomy   . Cesarean section     History reviewed. No pertinent family history.  History  Substance Use Topics  . Smoking status: Never Smoker   . Smokeless tobacco: Not on file  . Alcohol Use: No    OB History    Grav Para Term Preterm Abortions TAB SAB Ect Mult Living            3      Review of Systems  Gastrointestinal: Negative for bowel incontinence.  Genitourinary: Negative for bladder incontinence, dysuria, urgency, frequency and hematuria.  Musculoskeletal: Positive for back pain.  Neurological:  Negative for tingling, weakness and paresthesias.  All other systems reviewed and are negative.    Allergies  Prednisone; Aspirin; Nsaids; Penicillins; Ultram; Imitrex; Ketorolac tromethamine; and Other  Home Medications   Current Outpatient Rx  Name Route Sig Dispense Refill  . DICYCLOMINE HCL 10 MG PO CAPS Oral Take 10 mg by mouth 4 (four) times daily.    Marland Kitchen FERROUS SULFATE 325 (65 FE) MG PO TABS Oral Take 325 mg by mouth daily with breakfast.    . FLUOXETINE HCL 10 MG PO CAPS Oral Take 10 mg by mouth daily.    . INTERFERON BETA-1B 0.3 MG [place] SOLR Subcutaneous Inject 0.25 mg into the skin every other day.     . METHOCARBAMOL 500 MG PO TABS Oral Take 1 tablet (500 mg total) by mouth 2 (two) times daily as needed. 20 tablet 0  . OMEPRAZOLE 20 MG PO CPDR Oral Take 20 mg by mouth daily.    . OXYCODONE-ACETAMINOPHEN 5-325 MG PO TABS Oral Take 1 tablet by mouth every 4 (four) hours as needed for pain. 20 tablet 0  . OXYCODONE-ACETAMINOPHEN 5-325 MG PO TABS  One tab po q 4-6 hrs prn pain 20 tablet 0  . QUETIAPINE FUMARATE ER 400 MG PO TB24 Oral Take 800 mg by mouth at bedtime.       BP 134/88  Pulse 95  Temp 98.4 F (  36.9 C) (Oral)  Resp 20  Ht 5\' 5"  (1.651 m)  Wt 273 lb (123.832 kg)  BMI 45.43 kg/m2  SpO2 100%  LMP 05/03/2012  Physical Exam  Nursing note and vitals reviewed. Constitutional: She is oriented to person, place, and time. She appears well-developed and well-nourished. No distress.  HENT:  Head: Normocephalic and atraumatic.  Eyes: EOM are normal.  Neck: Normal range of motion.  Cardiovascular: Normal rate, regular rhythm and normal heart sounds.   Pulmonary/Chest: Effort normal and breath sounds normal.  Abdominal: Soft. She exhibits no distension. There is no tenderness.  Musculoskeletal: She exhibits tenderness.       Back:  Neurological: She is alert and oriented to person, place, and time.  Skin: Skin is warm and dry.  Psychiatric: She has a normal mood and  affect. Judgment normal.    ED Course  Procedures (including critical care time)  Labs Reviewed - No data to display No results found.   1. Acute exacerbation of chronic low back pain       MDM  rx-percocet, 20 Ice F/u with dr. Dolores Lory, PA 05/05/12 2258

## 2012-05-05 NOTE — ED Provider Notes (Signed)
Medical screening examination/treatment/procedure(s) were performed by non-physician practitioner and as supervising physician I was immediately available for consultation/collaboration.  Hopie Pellegrin, MD 05/05/12 2316 

## 2012-05-09 ENCOUNTER — Encounter (HOSPITAL_COMMUNITY): Payer: Self-pay | Admitting: *Deleted

## 2012-05-09 ENCOUNTER — Emergency Department (HOSPITAL_COMMUNITY)
Admission: EM | Admit: 2012-05-09 | Discharge: 2012-05-09 | Disposition: A | Discharge status: Home / Self Care | Payer: Medicaid - Out of State | Attending: Emergency Medicine | Admitting: Emergency Medicine

## 2012-05-09 DIAGNOSIS — I10 Essential (primary) hypertension: Secondary | ICD-10-CM | POA: Insufficient documentation

## 2012-05-09 DIAGNOSIS — G35 Multiple sclerosis: Secondary | ICD-10-CM | POA: Insufficient documentation

## 2012-05-09 DIAGNOSIS — G8929 Other chronic pain: Secondary | ICD-10-CM | POA: Insufficient documentation

## 2012-05-09 DIAGNOSIS — IMO0002 Reserved for concepts with insufficient information to code with codable children: Secondary | ICD-10-CM | POA: Insufficient documentation

## 2012-05-09 DIAGNOSIS — Z8711 Personal history of peptic ulcer disease: Secondary | ICD-10-CM | POA: Insufficient documentation

## 2012-05-09 DIAGNOSIS — M545 Low back pain, unspecified: Secondary | ICD-10-CM | POA: Insufficient documentation

## 2012-05-09 LAB — URINE MICROSCOPIC-ADD ON

## 2012-05-09 LAB — URINALYSIS, ROUTINE W REFLEX MICROSCOPIC
Bilirubin Urine: NEGATIVE
Glucose, UA: NEGATIVE mg/dL
Ketones, ur: NEGATIVE mg/dL
Protein, ur: NEGATIVE mg/dL

## 2012-05-09 MED ORDER — HYDROMORPHONE HCL PF 1 MG/ML IJ SOLN
1.0000 mg | Freq: Once | INTRAMUSCULAR | Status: AC
  Administered 2012-05-09: 1 mg via INTRAMUSCULAR
  Filled 2012-05-09: qty 1

## 2012-05-09 NOTE — ED Notes (Signed)
Low back pain, voiding frequently, with decreased control of urination,  Seen here on 9/20 for this pain.

## 2012-05-09 NOTE — ED Provider Notes (Signed)
History     CSN: [id number]  Arrival date & time 05/09/12  1651   First MD Initiated Contact with Patient 05/09/12 1745      Chief Complaint  Patient presents with  . Back Pain    (Consider location/radiation/quality/duration/timing/severity/associated sxs/prior treatment) Patient is a 31 y.o. female presenting with back pain. The history is provided by the patient.  Back Pain  This is a chronic problem. Pertinent negatives include no chest pain.   patient with chronic back pain. Seen a few days ago for the same. She states now she's been having some urinary frequency. She says she's also had some urinary incontinence. No trauma. She states that she had an appointment with Dr. Gerilyn Pilgrim today, however she was unable to go due to the pain. She states his been rescheduled for next week. She states it feels like when she has recently had kidney stones. No fevers. She states she has some chronic weakness in that leg.  Past Medical History  Diagnosis Date  . MS (multiple sclerosis)   . Thyroid disease   . Hypertension   . Peptic ulcer   . Degenerative disk disease   . Degenerative disk disease   . Crohn's disease   . Migraine     Past Surgical History  Procedure Date  . Cholecystectomy   . Cesarean section     History reviewed. No pertinent family history.  History  Substance Use Topics  . Smoking status: Never Smoker   . Smokeless tobacco: Not on file  . Alcohol Use: No    OB History    Grav Para Term Preterm Abortions TAB SAB Ect Mult Living            3      Review of Systems  Constitutional: Negative for appetite change and fatigue.  Respiratory: Negative for choking.   Cardiovascular: Negative for chest pain.  Genitourinary: Positive for urgency. Negative for flank pain, vaginal discharge, difficulty urinating and vaginal pain.  Musculoskeletal: Positive for back pain and gait problem. Negative for myalgias and joint swelling.    Allergies  Prednisone;  Aspirin; Nsaids; Penicillins; Ultram; Imitrex; Ketorolac tromethamine; and Other  Home Medications   Current Outpatient Rx  Name Route Sig Dispense Refill  . DICYCLOMINE HCL 10 MG PO CAPS Oral Take 10 mg by mouth 4 (four) times daily.    Marland Kitchen FERROUS SULFATE 325 (65 FE) MG PO TABS Oral Take 325 mg by mouth daily.     Marland Kitchen FLUOXETINE HCL 10 MG PO CAPS Oral Take 10 mg by mouth daily.    . INTERFERON BETA-1B 0.3 MG [place] SOLR Subcutaneous Inject 0.25 mg into the skin every other day.     . METHOCARBAMOL 500 MG PO TABS Oral Take 1 tablet (500 mg total) by mouth 2 (two) times daily as needed. 20 tablet 0  . OMEPRAZOLE 20 MG PO CPDR Oral Take 20 mg by mouth daily.    . QUETIAPINE FUMARATE ER 400 MG PO TB24 Oral Take 800 mg by mouth at bedtime.     . OXYCODONE-ACETAMINOPHEN 5-325 MG PO TABS Oral Take 1 tablet by mouth every 4 (four) hours as needed for pain. 20 tablet 0    BP 113/55  Pulse 82  Temp 98.7 F (37.1 C) (Oral)  Resp 20  Ht 5\' 5"  (1.651 m)  Wt 273 lb (123.832 kg)  BMI 45.43 kg/m2  SpO2 100%  LMP 05/03/2012  Physical Exam  Constitutional: She appears well-developed.  Patient is morbidly obese.  HENT:  Head: Normocephalic.  Eyes: Pupils are equal, round, and reactive to light.  Neck: Normal range of motion.  Cardiovascular: Normal rate and regular rhythm.   Abdominal: Soft. There is no tenderness.  Musculoskeletal: She exhibits tenderness.       Tenderness to right lower back and right flank. No rash.  Neurological: She is alert.  Skin: Skin is warm.    ED Course  Procedures (including critical care time)  Labs Reviewed  URINALYSIS, ROUTINE W REFLEX MICROSCOPIC - Abnormal; Notable for the following:    Hgb urine dipstick LARGE (*)     All other components within normal limits  URINE MICROSCOPIC-ADD ON - Abnormal; Notable for the following:    Squamous Epithelial / LPF MANY (*)     Bacteria, UA FEW (*)     All other components within normal limits  PREGNANCY, URINE     No results found.   1. Chronic pain       MDM  Patient with chronic back pain. His been seen for the same previously. Had followup today, but stated she was unable to go because of the pain. Patient had question of difficulty urinating. Peroneal sensation intact. She was able to urinate on command for [place]. No post void residual. Patient has had multiple prior visits for similar symptoms. She will be discharged with no more pain medicines. She was given one shot of pain medicine she was told she would like to get that again. Urinalysis showed mild hematuria, she has had her period. She's recently had a CT that showed no renal stones. I do not think that she has any cord involvement at this time.        Juliet Rude. Rubin Payor, MD 05/09/12 2136

## 2012-05-13 ENCOUNTER — Encounter (HOSPITAL_COMMUNITY): Payer: Self-pay | Admitting: *Deleted

## 2012-05-13 ENCOUNTER — Emergency Department (HOSPITAL_COMMUNITY)
Admission: EM | Admit: 2012-05-13 | Discharge: 2012-05-13 | Disposition: A | Discharge status: Home / Self Care | Payer: Medicaid - Out of State | Attending: Emergency Medicine | Admitting: Emergency Medicine

## 2012-05-13 DIAGNOSIS — G8929 Other chronic pain: Secondary | ICD-10-CM | POA: Insufficient documentation

## 2012-05-13 DIAGNOSIS — M545 Low back pain, unspecified: Secondary | ICD-10-CM | POA: Insufficient documentation

## 2012-05-13 DIAGNOSIS — G35 Multiple sclerosis: Secondary | ICD-10-CM | POA: Insufficient documentation

## 2012-05-13 DIAGNOSIS — K5090 Crohn's disease, unspecified, without complications: Secondary | ICD-10-CM | POA: Insufficient documentation

## 2012-05-13 DIAGNOSIS — E079 Disorder of thyroid, unspecified: Secondary | ICD-10-CM | POA: Insufficient documentation

## 2012-05-13 DIAGNOSIS — M5416 Radiculopathy, lumbar region: Secondary | ICD-10-CM

## 2012-05-13 DIAGNOSIS — I10 Essential (primary) hypertension: Secondary | ICD-10-CM | POA: Insufficient documentation

## 2012-05-13 MED ORDER — HYDROCODONE-ACETAMINOPHEN 5-325 MG PO TABS: ORAL_TABLET | ORAL | Status: DC

## 2012-05-13 MED ORDER — HYDROMORPHONE HCL PF 1 MG/ML IJ SOLN
1.0000 mg | Freq: Once | INTRAMUSCULAR | Status: AC
  Administered 2012-05-13: 1 mg via INTRAMUSCULAR
  Filled 2012-05-13: qty 1

## 2012-05-13 MED ORDER — CYCLOBENZAPRINE HCL 10 MG PO TABS: 10.0000 mg | ORAL_TABLET | Freq: Three times a day (TID) | ORAL | Status: DC | PRN

## 2012-05-13 NOTE — ED Provider Notes (Signed)
History     CSN: [id number]  Arrival date & time 05/13/12  1346   First MD Initiated Contact with Patient 05/13/12 1454      Chief Complaint  Patient presents with  . Back Pain    (Consider location/radiation/quality/duration/timing/severity/associated sxs/prior treatment) HPI Comments: Patient with history of chronic low back pain comes to the ER with acute exacerbation of chronic pain. She states that she has recently moved to [facility] and does not have a primary care physician here at this time. She is waiting for her Medicaid to be transferred to the state. She states that she has contacted a chronic pain management physician in this area and is waiting for her Medicaid number in order to followup with him.  She describes the pain as sharp and burning sensation to her right lower back that radiates into her right groin and down her right leg. Pain is worse with certain movements and improves with rest. She denies motor weakness, numbness, incontinence of urine or feces, urinary symptoms, abdominal pain, or saddle anesthesias. Pain today is similar to previous pain.  Patient is a 31 y.o. female presenting with back pain. The history is provided by the patient.  Back Pain  This is a chronic problem. The current episode started more than 1 week ago. The problem occurs constantly. The problem has been gradually worsening. The pain is associated with falling. The pain is present in the lumbar spine and sacro-iliac joint. The quality of the pain is described as aching, burning and shooting. The pain radiates to the right thigh, right knee and right foot. The pain is moderate. The symptoms are aggravated by bending, twisting and certain positions. The pain is the same all the time. Associated symptoms include leg pain. Pertinent negatives include no chest pain, no fever, no numbness, no headaches, no abdominal pain, no abdominal swelling, no perianal numbness, no bladder incontinence, no  dysuria, no pelvic pain, no paresthesias, no paresis, no tingling and no weakness. She has tried analgesics, muscle relaxants, heat and ice for the symptoms. The treatment provided mild relief.    Past Medical History  Diagnosis Date  . MS (multiple sclerosis)   . Thyroid disease   . Hypertension   . Peptic ulcer   . Crohn's disease   . Migraine     Past Surgical History  Procedure Date  . Cholecystectomy   . Cesarean section     History reviewed. No pertinent family history.  History  Substance Use Topics  . Smoking status: Never Smoker   . Smokeless tobacco: Not on file  . Alcohol Use: No    OB History    Grav Para Term Preterm Abortions TAB SAB Ect Mult Living            3      Review of Systems  Constitutional: Negative for fever.  Respiratory: Negative for shortness of breath.   Cardiovascular: Negative for chest pain.  Gastrointestinal: Negative for nausea, vomiting, abdominal pain and constipation.  Genitourinary: Negative for bladder incontinence, dysuria, hematuria, flank pain, decreased urine volume, difficulty urinating, vaginal pain and pelvic pain.       No perineal numbness or incontinence of urine or feces  Musculoskeletal: Positive for back pain. Negative for joint swelling.  Skin: Negative for rash.  Neurological: Negative for dizziness, tingling, weakness, numbness, headaches and paresthesias.  All other systems reviewed and are negative.    Allergies  Prednisone; Aspirin; Nsaids; Penicillins; Ultram; Imitrex; Ketorolac tromethamine; and Other  Home Medications   Current Outpatient Rx  Name Route Sig Dispense Refill  . DICYCLOMINE HCL 10 MG PO CAPS Oral Take 10 mg by mouth 4 (four) times daily.    Marland Kitchen FERROUS SULFATE 325 (65 FE) MG PO TABS Oral Take 325 mg by mouth daily.     Marland Kitchen FLUOXETINE HCL 10 MG PO CAPS Oral Take 10 mg by mouth daily.    . INTERFERON BETA-1B 0.3 MG [place] SOLR Subcutaneous Inject 0.25 mg into the skin every other day.     .  METHOCARBAMOL 500 MG PO TABS Oral Take 1 tablet (500 mg total) by mouth 2 (two) times daily as needed. 20 tablet 0  . OMEPRAZOLE 20 MG PO CPDR Oral Take 20 mg by mouth daily.    . OXYCODONE-ACETAMINOPHEN 5-325 MG PO TABS Oral Take 1 tablet by mouth every 4 (four) hours as needed for pain. 20 tablet 0  . QUETIAPINE FUMARATE ER 400 MG PO TB24 Oral Take 800 mg by mouth at bedtime.       BP 127/67  Pulse 80  Temp 98.4 F (36.9 C) (Oral)  Resp 20  Ht 5\' 5"  (1.651 m)  Wt 273 lb (123.832 kg)  BMI 45.43 kg/m2  SpO2 100%  LMP 05/03/2012  Physical Exam  Nursing note and vitals reviewed. Constitutional: She is oriented to person, place, and time. She appears well-developed and well-nourished. No distress.  HENT:  Head: Normocephalic and atraumatic.  Neck: Normal range of motion. Neck supple.  Cardiovascular: Normal rate, regular rhythm and intact distal pulses.   No murmur heard. Pulmonary/Chest: Effort normal and breath sounds normal.  Musculoskeletal: She exhibits tenderness. She exhibits no edema.       Lumbar back: She exhibits tenderness and pain. She exhibits normal range of motion, no swelling, no deformity, no laceration and normal pulse.       Back:  Neurological: She is alert and oriented to person, place, and time. No cranial nerve deficit or sensory deficit. She exhibits normal muscle tone. Coordination and gait normal.  Reflex Scores:      Patellar reflexes are 2+ on the right side and 2+ on the left side.      Achilles reflexes are 2+ on the right side and 2+ on the left side. Skin: Skin is warm and dry.    ED Course  Procedures (including critical care time)  Labs Reviewed - No data to display      MDM   Previous ed charts reviewed.  Patient waiting for transfer of her [facility] medicaid to [place].  Has appt with Dr. Gerilyn Pilgrim for October 8.  Hx of chronic low back pain has been worsening for 3-4 weeks.  No saddle anesthesia's, focal neuro deficits or urinary symptoms.  Doubt  emergent neurological process or infectious source. Patient ambulates with a slow but steady gait  [place] narcotics database was reviewed  The patient appears reasonably screened and/or stabilized for discharge and I doubt any other medical condition or other [facility] requiring further screening, evaluation, or treatment in the ED at this time prior to discharge.    Rx: Flexeril norco # 20    Dakarri Kessinger L. [place], [place] 05/13/12 1720

## 2012-05-13 NOTE — ED Notes (Signed)
Low back pain rt side.  Radiation down rt leg. And hip,  Larey Seat 1 month ago

## 2012-05-17 NOTE — ED Provider Notes (Signed)
Medical screening examination/treatment/procedure(s) were performed by non-physician practitioner and as supervising physician I was immediately available for consultation/collaboration.  Shaily Librizzi, MD 05/17/12 1742 

## 2012-05-19 ENCOUNTER — Emergency Department (HOSPITAL_COMMUNITY)
Admission: EM | Admit: 2012-05-19 | Discharge: 2012-05-19 | Disposition: A | Discharge status: Home / Self Care | Payer: Medicaid - Out of State | Attending: Emergency Medicine | Admitting: Emergency Medicine

## 2012-05-19 ENCOUNTER — Encounter (HOSPITAL_COMMUNITY): Payer: Self-pay | Admitting: *Deleted

## 2012-05-19 ENCOUNTER — Emergency Department (HOSPITAL_COMMUNITY): Payer: Medicaid - Out of State

## 2012-05-19 DIAGNOSIS — R209 Unspecified disturbances of skin sensation: Secondary | ICD-10-CM | POA: Insufficient documentation

## 2012-05-19 DIAGNOSIS — M5137 Other intervertebral disc degeneration, lumbosacral region: Secondary | ICD-10-CM | POA: Insufficient documentation

## 2012-05-19 DIAGNOSIS — I10 Essential (primary) hypertension: Secondary | ICD-10-CM | POA: Insufficient documentation

## 2012-05-19 DIAGNOSIS — R200 Anesthesia of skin: Secondary | ICD-10-CM

## 2012-05-19 DIAGNOSIS — M549 Dorsalgia, unspecified: Secondary | ICD-10-CM

## 2012-05-19 DIAGNOSIS — M545 Low back pain, unspecified: Secondary | ICD-10-CM | POA: Insufficient documentation

## 2012-05-19 DIAGNOSIS — M47816 Spondylosis without myelopathy or radiculopathy, lumbar region: Secondary | ICD-10-CM

## 2012-05-19 DIAGNOSIS — M51379 Other intervertebral disc degeneration, lumbosacral region without mention of lumbar back pain or lower extremity pain: Secondary | ICD-10-CM | POA: Insufficient documentation

## 2012-05-19 MED ORDER — METHOCARBAMOL 500 MG PO TABS: 500.0000 mg | ORAL_TABLET | Freq: Two times a day (BID) | ORAL | Status: DC

## 2012-05-19 MED ORDER — HYDROCODONE-ACETAMINOPHEN 5-325 MG PO TABS: 2.0000 | ORAL_TABLET | ORAL | Status: DC | PRN

## 2012-05-19 MED ORDER — METHOCARBAMOL 500 MG PO TABS
500.0000 mg | ORAL_TABLET | Freq: Once | ORAL | Status: AC
  Administered 2012-05-19: 500 mg via ORAL

## 2012-05-19 MED ORDER — DIPHENHYDRAMINE HCL 25 MG PO CAPS: 25.0000 mg | ORAL_CAPSULE | Freq: Four times a day (QID) | ORAL | Status: DC | PRN

## 2012-05-19 MED ORDER — HYDROMORPHONE HCL PF 2 MG/ML IJ SOLN
2.0000 mg | Freq: Once | INTRAMUSCULAR | Status: AC
  Administered 2012-05-19: 2 mg via INTRAVENOUS
  Filled 2012-05-19: qty 1

## 2012-05-19 MED ORDER — HYDROMORPHONE HCL PF 2 MG/ML IJ SOLN
INTRAMUSCULAR | Status: AC
  Filled 2012-05-19: qty 1

## 2012-05-19 MED ORDER — HYDROMORPHONE HCL PF 2 MG/ML IJ SOLN
2.0000 mg | Freq: Once | INTRAMUSCULAR | Status: AC
  Administered 2012-05-19: 2 mg via INTRAMUSCULAR

## 2012-05-19 MED ORDER — METHOCARBAMOL 500 MG PO TABS
ORAL_TABLET | ORAL | Status: AC
  Filled 2012-05-19: qty 1

## 2012-05-19 NOTE — ED Provider Notes (Signed)
History   This chart was scribed for Derwood Kaplan, MD by Sofie Rower. The patient was seen in room APA18/APA18 and the patient's care was started at 2:46PM    CSN: [id number]  Arrival date & time 05/19/12  1408   First MD Initiated Contact with Patient 05/19/12 1446      Chief Complaint  Patient presents with  . Back Pain    (Consider location/radiation/quality/duration/timing/severity/associated sxs/prior treatment) Patient is a 31 y.o. female presenting with back pain. The history is provided by the patient. No language interpreter was used.  Back Pain  This is a new problem. The current episode started more than 1 week ago. The problem occurs constantly. The problem has been gradually worsening. The pain is associated with no known injury (Birthday party on 05/14/12. ). The pain is present in the lumbar spine. The quality of the pain is described as stabbing. The pain radiates to the right thigh. The pain is moderate. The symptoms are aggravated by certain positions (Standing. ). The pain is the same all the time. Associated symptoms include numbness and tingling. Pertinent negatives include no bowel incontinence.    Jenna Henderson is a 31 y.o. female , with a hx of MS (onset 2007), who presents to the Emergency Department complaining of  sudden, progressively worsening, back pain located at the lower back which radiates downwards towards both lower extremities, onset two weeks ago with associated symptoms of numbness located in the right lower extremity. The pt reports the back pain is a constant, sharp pain. In addition, the pt describes the numbness as a constant, pins and needles sensation. Modifying factors include certain movements and positions which intensify the back pain. The pt has a hx of L4, L5 degenerative disc disease, and a fall episode (onset 1.5 months ago).   The pt denies numbness or tingling located at the left upper and lower extremities.   The pt does not smoke or  drink alcohol.      Past Medical History  Diagnosis Date  . MS (multiple sclerosis)   . Thyroid disease   . Hypertension   . Peptic ulcer   . Crohn's disease   . Migraine     Past Surgical History  Procedure Date  . Cholecystectomy   . Cesarean section     History reviewed. No pertinent family history.  History  Substance Use Topics  . Smoking status: Never Smoker   . Smokeless tobacco: Not on file  . Alcohol Use: No    OB History    Grav Para Term Preterm Abortions TAB SAB Ect Mult Living            3      Review of Systems  Gastrointestinal: Negative for bowel incontinence.  Musculoskeletal: Positive for back pain.  Neurological: Positive for tingling and numbness.  All other systems reviewed and are negative.    Allergies  Prednisone; Aspirin; Nsaids; Penicillins; Ultram; Imitrex; Ketorolac tromethamine; and Other  Home Medications   Current Outpatient Rx  Name Route Sig Dispense Refill  . CYCLOBENZAPRINE HCL 10 MG PO TABS Oral Take 1 tablet (10 mg total) by mouth 3 (three) times daily as needed for muscle spasms. 21 tablet 0  . DICYCLOMINE HCL 10 MG PO CAPS Oral Take 10 mg by mouth 4 (four) times daily.    Marland Kitchen FLUOXETINE HCL 10 MG PO CAPS Oral Take 10 mg by mouth daily.    . INTERFERON BETA-1B 0.3 MG [place] SOLR Subcutaneous Inject 0.25 mg  into the skin every other day.     Marland Kitchen OMEPRAZOLE 20 MG PO CPDR Oral Take 20 mg by mouth daily.    . QUETIAPINE FUMARATE ER 400 MG PO TB24 Oral Take 800 mg by mouth at bedtime.     Marland Kitchen FERROUS SULFATE 325 (65 FE) MG PO TABS Oral Take 325 mg by mouth daily.       BP 116/85  Pulse 93  Temp 98.3 F (36.8 C) (Oral)  Resp 18  Ht 5\' 5"  (1.651 m)  Wt 280 lb (127.007 kg)  BMI 46.59 kg/m2  SpO2 100%  LMP 05/03/2012  Physical Exam  Nursing note and vitals reviewed. Constitutional: She is oriented to person, place, and time. She appears well-developed and well-nourished.  HENT:  Head: Atraumatic.  Right Ear: External ear  normal.  Left Ear: External ear normal.  Nose: Nose normal.  Eyes: Conjunctivae normal and EOM are normal.  Neck: Normal range of motion. Neck supple.  Musculoskeletal: She exhibits tenderness.       Tenderness starts in L2 area and goes down to sacrum. Positive straight leg test on bilaterally on legs. Right is worse than the left. Unable to discriminate between sharp and dull in right leg. Able to discriminate between sharp and dull in the left leg.   Neurological: She is alert and oriented to person, place, and time.  Skin: Skin is warm and dry.  Psychiatric: She has a normal mood and affect. Her behavior is normal.    ED Course  Procedures (including critical care time)  DIAGNOSTIC STUDIES: Oxygen Saturation is 100% on room air, normal by my interpretation.    COORDINATION OF CARE:    3:09PM- Rectal exam and treatment plan discussed. Pt agrees with treatment.   Labs Reviewed - No data to display No results found.   No diagnosis found.    MDM  Medical screening examination/treatment/procedure(s) were performed by me as the supervising physician. Scribe service was utilized for documentation only.  DDx includes: - DJD of the back - Spondylitises/ spondylosis - Sciatica - Spinal cord compression - Conus medullaris - Epidural hematoma - Epidural abscess - Lytic/pathologic fracture - Myelitis - Musculoskeletal pain  Pt comes in with cc of back pain.  Pt has hx of back pain, MS. Exam indicates - lumbar tenderness, + straight leg, normal strength, but sensation is diminished on the left side (which is baseline normal for her due to MS). She has no urinary incontinence, retention and she denies any boweol incontinence, and she has trace but equal patellar reflex bilaterally. Clinically appears to be Sciatica.  We will get CT this time, to make sure there is no large disk disease, as that would heighten our suspicion for MRI.  5:07 PM CT is negative. I dont see  utility in getting MRI. Will dc Has Neuology follow up next week. i dont think she needs to see a neuro sugeron.  Will d.c.   Derwood Kaplan, MD 05/19/12 1708

## 2012-05-19 NOTE — ED Notes (Signed)
C/o lower back pain x 2 weeks; states pain radiates to right buttock and down right leg; also c/o numbness to RLE and RUE and describes a pins and needles feeling in right hand - pt with hx of MS and states these symptoms similar to what she typically experiences. States was seen here last week for same lower back pain. A&ox4; answers questions appropriately; ambulatory with steady gait.

## 2012-05-19 NOTE — ED Notes (Addendum)
Low back pain for 2 weeks, Rt arm numbness for 1 week.  Pt says numbness in rt arm is due to MS.  Pain  And numbness radiates down rt leg

## 2012-05-19 NOTE — ED Notes (Signed)
Left in c/o friend for transport home; a&ox4, in no distress.  Instructions and prescriptions reviewed - verbalizes understanding;  f/u information provided.

## 2012-05-25 ENCOUNTER — Encounter (HOSPITAL_COMMUNITY): Payer: Self-pay

## 2012-05-25 ENCOUNTER — Emergency Department (INDEPENDENT_AMBULATORY_CARE_PROVIDER_SITE_OTHER)
Admission: EM | Admit: 2012-05-25 | Discharge: 2012-05-25 | Disposition: A | Discharge status: Home / Self Care | Payer: Medicaid Other | Source: Home / Self Care

## 2012-05-25 DIAGNOSIS — IMO0002 Reserved for concepts with insufficient information to code with codable children: Secondary | ICD-10-CM

## 2012-05-25 DIAGNOSIS — G8929 Other chronic pain: Secondary | ICD-10-CM

## 2012-05-25 DIAGNOSIS — G35 Multiple sclerosis: Secondary | ICD-10-CM

## 2012-05-25 DIAGNOSIS — E039 Hypothyroidism, unspecified: Secondary | ICD-10-CM

## 2012-05-25 DIAGNOSIS — G35D Multiple sclerosis, unspecified: Secondary | ICD-10-CM

## 2012-05-25 DIAGNOSIS — M5416 Radiculopathy, lumbar region: Secondary | ICD-10-CM

## 2012-05-25 MED ORDER — CYCLOBENZAPRINE HCL 10 MG PO TABS: 10.0000 mg | ORAL_TABLET | Freq: Three times a day (TID) | ORAL | Status: DC | PRN

## 2012-05-25 MED ORDER — HYDROCODONE-ACETAMINOPHEN 5-325 MG PO TABS: 1.0000 | ORAL_TABLET | ORAL | Status: DC | PRN

## 2012-05-25 NOTE — ED Notes (Signed)
State she is new to the area, and needs a primary MD. C/o she is having a lot of pain in  Her back and into her groin

## 2012-05-25 NOTE — ED Provider Notes (Signed)
Medical screening examination/treatment/procedure(s) were performed by non-physician practitioner and as supervising physician I was immediately available for consultation/collaboration.  Alyne Martinson, M.D.   Saphyra Hutt C Tanishka Drolet, MD 05/25/12 2249 

## 2012-05-25 NOTE — ED Provider Notes (Signed)
History     CSN: [id number]  Arrival date & time 05/25/12  1556   None     Chief Complaint  Patient presents with  . Back Pain    (Consider location/radiation/quality/duration/timing/severity/associated sxs/prior treatment) HPI Comments: This is a 31 year old female relatively new to the area presents with pain in the lower back with radicular type pain into the right thigh and and pelvis. This began approximately one month ago and since his visits to the emergency department for the same complaint. She was seen there one week ago and given muscle relaxants and ago. A full workup was performed at that time as well as a CT exam which was unremarkable. She does have a history of "slipped disc". Pain originates in the lower back radiating to the right side in various areas of the thigh and right pelvis. It is described as a constant sharp shooting right pain with pins and needles also muscular skeletal pain in the right lower back musculature is worse with standing bending in attempting to do ADLs. She also has a history of MS, thyroid disease, hypertension, Crohn's disease, migraine headaches, and arthritis. Medications have been reviewed.   Past Medical History  Diagnosis Date  . MS (multiple sclerosis)   . Thyroid disease   . Hypertension   . Peptic ulcer   . Crohn's disease   . Migraine     Past Surgical History  Procedure Date  . Cholecystectomy   . Cesarean section     History reviewed. No pertinent family history.  History  Substance Use Topics  . Smoking status: Never Smoker   . Smokeless tobacco: Not on file  . Alcohol Use: No    OB History    Grav Para Term Preterm Abortions TAB SAB Ect Mult Living            3      Review of Systems  Constitutional: Negative for fever, chills and activity change.  HENT: Negative.   Eyes: Negative.   Respiratory: Negative.   Cardiovascular: Negative.   Musculoskeletal: Positive for back pain and gait problem.       As  per HPI  Skin: Negative for color change, pallor and rash.  Neurological: Positive for weakness and numbness. Negative for syncope and headaches.    Allergies  Prednisone; Aspirin; Nsaids; Penicillins; Ultram; Imitrex; Ketorolac tromethamine; and Other  Home Medications   Current Outpatient Rx  Name Route Sig Dispense Refill  . DULOXETINE HCL 60 MG PO CPEP Oral Take 60 mg by mouth daily.    Marland Kitchen GABAPENTIN 600 MG PO TABS Oral Take 600 mg by mouth 3 (three) times daily.    Marland Kitchen LEVOTHYROXINE SODIUM 25 MCG PO TABS Oral Take 25 mcg by mouth daily.    . CYCLOBENZAPRINE HCL 10 MG PO TABS Oral Take 1 tablet (10 mg total) by mouth 3 (three) times daily as needed for muscle spasms. 21 tablet 0  . CYCLOBENZAPRINE HCL 10 MG PO TABS Oral Take 1 tablet (10 mg total) by mouth 3 (three) times daily as needed for muscle spasms. 20 tablet 0  . DICYCLOMINE HCL 10 MG PO CAPS Oral Take 10 mg by mouth 4 (four) times daily.    Marland Kitchen DIPHENHYDRAMINE HCL 25 MG PO CAPS Oral Take 1 capsule (25 mg total) by mouth every 6 (six) hours as needed for itching. 30 capsule 0  . FERROUS SULFATE 325 (65 FE) MG PO TABS Oral Take 325 mg by mouth daily.     Marland Kitchen  FLUOXETINE HCL 10 MG PO CAPS Oral Take 10 mg by mouth daily.    Marland Kitchen HYDROCODONE-ACETAMINOPHEN 5-325 MG PO TABS Oral Take 2 tablets by mouth every 4 (four) hours as needed for pain. 6 tablet 0  . HYDROCODONE-ACETAMINOPHEN 5-325 MG PO TABS Oral Take 1 tablet by mouth every 4 (four) hours as needed for pain. 15 tablet 0  . INTERFERON BETA-1B 0.3 MG  SOLR Subcutaneous Inject 0.25 mg into the skin every other day.     . METHOCARBAMOL 500 MG PO TABS Oral Take 1 tablet (500 mg total) by mouth 2 (two) times daily. 20 tablet 0  . OMEPRAZOLE 20 MG PO CPDR Oral Take 20 mg by mouth daily.    . QUETIAPINE FUMARATE ER 400 MG PO TB24 Oral Take 800 mg by mouth at bedtime.       BP 138/90  Pulse 98  Temp 98.1 F (36.7 C) (Oral)  Resp 18  SpO2 98%  LMP 05/03/2012  Physical Exam    Constitutional: She is oriented to person, place, and time. She appears well-developed and well-nourished.       Morbidly obese  HENT:  Head: Normocephalic and atraumatic.  Neck: Normal range of motion. Neck supple.  Cardiovascular: Normal rate and normal heart sounds.   Pulmonary/Chest: Effort normal.  Musculoskeletal: She exhibits tenderness. She exhibits no edema.       Marked tenderness in the right lower back musculature and hit. Weakness in the right lower extremity and positive straight leg raise on right. Sensation grossly intact bilaterally. Distal pedal pulses are 2+.  Neurological: She is alert and oriented to person, place, and time.  Skin: Skin is warm and dry. No rash noted. No erythema.  Psychiatric: She has a normal mood and affect.    ED Course  Procedures (including critical care time)  Labs Reviewed - No data to display No results found.   1. Chronic radicular pain of lower back   2. Multiple sclerosis   3. Hypothyroidism   4. Morbid obesity       MDM  Norco 5/325 one every 4 hours when necessary pain #15 Flexeril 10 mg 3 times a day when necessary She was given the name of Dr. Greggory Stallion Osei-Bonsu for followup and establishment of primary care. From there she may be referred to neurology as was requested in her last ED visit.      Hayden Rasmussen, NP 05/25/12 1712

## 2012-05-28 ENCOUNTER — Emergency Department (HOSPITAL_COMMUNITY)
Admission: EM | Admit: 2012-05-28 | Discharge: 2012-05-29 | Disposition: A | Discharge status: Home / Self Care | Payer: Medicaid Other | Attending: Emergency Medicine | Admitting: Emergency Medicine

## 2012-05-28 ENCOUNTER — Emergency Department (HOSPITAL_COMMUNITY): Payer: Medicaid Other

## 2012-05-28 ENCOUNTER — Encounter (HOSPITAL_COMMUNITY): Payer: Self-pay | Admitting: *Deleted

## 2012-05-28 DIAGNOSIS — K5090 Crohn's disease, unspecified, without complications: Secondary | ICD-10-CM | POA: Insufficient documentation

## 2012-05-28 DIAGNOSIS — R079 Chest pain, unspecified: Secondary | ICD-10-CM | POA: Insufficient documentation

## 2012-05-28 DIAGNOSIS — R1110 Vomiting, unspecified: Secondary | ICD-10-CM | POA: Insufficient documentation

## 2012-05-28 DIAGNOSIS — R109 Unspecified abdominal pain: Secondary | ICD-10-CM | POA: Insufficient documentation

## 2012-05-28 DIAGNOSIS — Z9089 Acquired absence of other organs: Secondary | ICD-10-CM | POA: Insufficient documentation

## 2012-05-28 DIAGNOSIS — G35 Multiple sclerosis: Secondary | ICD-10-CM | POA: Insufficient documentation

## 2012-05-28 DIAGNOSIS — Z79899 Other long term (current) drug therapy: Secondary | ICD-10-CM | POA: Insufficient documentation

## 2012-05-28 LAB — CBC WITH DIFFERENTIAL/PLATELET
Basophils Absolute: 0 10*3/uL (ref 0.0–0.1)
Basophils Relative: 0 % (ref 0–1)
Eosinophils Absolute: 0 10*3/uL (ref 0.0–0.7)
Eosinophils Relative: 0 % (ref 0–5)
HCT: 34.1 % — ABNORMAL LOW (ref 36.0–46.0)
MCHC: 32.6 g/dL (ref 30.0–36.0)
MCV: 80.8 fL (ref 78.0–100.0)
Monocytes Absolute: 0.7 10*3/uL (ref 0.1–1.0)
Platelets: 312 10*3/uL (ref 150–400)
RDW: 14.7 % (ref 11.5–15.5)
WBC: 10.3 10*3/uL (ref 4.0–10.5)

## 2012-05-28 MED ORDER — SODIUM CHLORIDE 0.9 % IV BOLUS (SEPSIS)
500.0000 mL | Freq: Once | INTRAVENOUS | Status: AC
  Administered 2012-05-29: 500 mL via INTRAVENOUS

## 2012-05-28 MED ORDER — HYDROMORPHONE HCL PF 1 MG/ML IJ SOLN
1.0000 mg | Freq: Once | INTRAMUSCULAR | Status: AC
  Administered 2012-05-29: 1 mg via INTRAVENOUS
  Filled 2012-05-28: qty 1

## 2012-05-28 MED ORDER — ONDANSETRON HCL 4 MG/2ML IJ SOLN
4.0000 mg | Freq: Once | INTRAMUSCULAR | Status: AC
  Administered 2012-05-29: 4 mg via INTRAVENOUS
  Filled 2012-05-28: qty 2

## 2012-05-28 NOTE — ED Provider Notes (Signed)
History   This chart was scribed for [employer]. Colon Branch, MD scribed by Magnus Sinning. The patient was seen in room [facility]18/[facility]18 at 23:44   CSN: [id number]  Arrival date & time 05/28/12  2211   Chief Complaint  Patient presents with  . Chest Pain  . Emesis  . Abdominal Pain    (Consider location/radiation/quality/duration/timing/severity/associated sxs/prior treatment) HPI Jenna Henderson is a 31 y.o. female who presents to the Emergency Department complaining of persistent  moderate CP with associated nausea,vomiting, and chills. Pt also has c/o abd pain that radiates into groin and has rotated into back for the past two days. She denies sick contact exposure, diarrhea, and states her last bowel movement was two days ago. LNMP: 05/03/12 PCP: [facility] Past Medical History  Diagnosis Date  . MS (multiple sclerosis)   . Thyroid disease   . Hypertension   . Peptic ulcer   . Crohn's disease   . Migraine     Past Surgical History  Procedure Date  . Cholecystectomy   . Cesarean section     History reviewed. No pertinent family history.  History  Substance Use Topics  . Smoking status: Never Smoker   . Smokeless tobacco: Not on file  . Alcohol Use: No   Review of Systems  Constitutional: Negative for fever.       10 Systems reviewed and are negative for acute change except as noted in the HPI.  HENT: Negative for congestion.   Eyes: Negative for discharge and redness.  Respiratory: Positive for chest tightness. Negative for cough and shortness of breath.   Cardiovascular: Negative for chest pain.  Gastrointestinal: Positive for vomiting and abdominal pain.  Musculoskeletal: Negative for back pain.  Skin: Negative for rash.  Neurological: Negative for syncope, numbness and headaches.  Psychiatric/Behavioral:       No behavior change.   10 Systems reviewed and are negative for acute change except as noted in the HPI. Allergies  Prednisone; Aspirin; Nsaids; Penicillins; Ultram;  Imitrex; Ketorolac tromethamine; and Other  Home Medications   Current Outpatient Rx  Name Route Sig Dispense Refill  . CYCLOBENZAPRINE HCL 10 MG PO TABS Oral Take 1 tablet (10 mg total) by mouth 3 (three) times daily as needed for muscle spasms. 21 tablet 0  . CYCLOBENZAPRINE HCL 10 MG PO TABS Oral Take 1 tablet (10 mg total) by mouth 3 (three) times daily as needed for muscle spasms. 20 tablet 0  . DICYCLOMINE HCL 10 MG PO CAPS Oral Take 10 mg by mouth 4 (four) times daily.    Marland Kitchen DIPHENHYDRAMINE HCL 25 MG PO CAPS Oral Take 1 capsule (25 mg total) by mouth every 6 (six) hours as needed for itching. 30 capsule 0  . DULOXETINE HCL 60 MG PO CPEP Oral Take 60 mg by mouth daily.    Marland Kitchen FERROUS SULFATE 325 (65 FE) MG PO TABS Oral Take 325 mg by mouth daily.     Marland Kitchen FLUOXETINE HCL 10 MG PO CAPS Oral Take 10 mg by mouth daily.    Marland Kitchen GABAPENTIN 600 MG PO TABS Oral Take 600 mg by mouth 3 (three) times daily.    Marland Kitchen HYDROCODONE-ACETAMINOPHEN 5-325 MG PO TABS Oral Take 2 tablets by mouth every 4 (four) hours as needed for pain. 6 tablet 0  . HYDROCODONE-ACETAMINOPHEN 5-325 MG PO TABS Oral Take 1 tablet by mouth every 4 (four) hours as needed for pain. 15 tablet 0  . INTERFERON BETA-1B 0.3 MG  SOLR Subcutaneous Inject 0.25 mg  into the skin every other day.     Marland Kitchen LEVOTHYROXINE SODIUM 25 MCG PO TABS Oral Take 25 mcg by mouth daily.    Marland Kitchen METHOCARBAMOL 500 MG PO TABS Oral Take 1 tablet (500 mg total) by mouth 2 (two) times daily. 20 tablet 0  . OMEPRAZOLE 20 MG PO CPDR Oral Take 20 mg by mouth daily.    . QUETIAPINE FUMARATE ER 400 MG PO TB24 Oral Take 800 mg by mouth at bedtime.       BP 131/74  Pulse 104  Temp 97.8 F (36.6 C) (Oral)  Resp 22  Ht 5\' 5"  (1.651 m)  Wt 273 lb (123.832 kg)  BMI 45.43 kg/m2  SpO2 100%  LMP 05/03/2012  Physical Exam  Nursing note and vitals reviewed. Constitutional: She is oriented to person, place, and time. She appears well-developed and well-nourished. No distress.        Morbidly obese  HENT:  Head: Normocephalic and atraumatic.  Eyes: Conjunctivae normal and EOM are normal. Right eye exhibits no discharge. Left eye exhibits no discharge.  Neck: Neck supple. No tracheal deviation present.  Cardiovascular: Normal rate, regular rhythm and normal heart sounds.   No murmur heard. Pulmonary/Chest: Effort normal and breath sounds normal. No respiratory distress. She exhibits no tenderness.       Lungs clear  Abdominal: Soft. Bowel sounds are normal. She exhibits no distension. There is tenderness. There is no rebound and no guarding.       Focal tenderness to right upper and lower abd.  Genitourinary:       No cva tendereness  Musculoskeletal: Normal range of motion. She exhibits no tenderness.       Baseline ROM, no obvious new focal weakness.  Neurological: She is alert and oriented to person, place, and time. No sensory deficit.       Mental status and motor strength appears baseline for patient and situation.  Skin: Skin is dry. No rash noted.  Psychiatric: She has a normal mood and affect. Her behavior is normal.    ED Course  Procedures (including critical care time) DIAGNOSTIC STUDIES: Oxygen Saturation is 100% on room air, normal by my interpretation.    COORDINATION OF CARE: Results for orders placed during the hospital encounter of 05/28/12  CBC WITH DIFFERENTIAL      Component Value Range   WBC 10.3  4.0 - 10.5 K/uL   RBC 4.22  3.87 - 5.11 MIL/uL   Hemoglobin 11.1 (*) 12.0 - 15.0 g/dL   HCT 09.8 (*) 11.9 - 14.7 %   MCV 80.8  78.0 - 100.0 fL   MCH 26.3  26.0 - 34.0 pg   MCHC 32.6  30.0 - 36.0 g/dL   RDW 82.9  56.2 - 13.0 %   Platelets 312  150 - 400 K/uL   Neutrophils Relative 57  43 - 77 %   Neutro Abs 5.8  1.7 - 7.7 K/uL   Lymphocytes Relative 37  12 - 46 %   Lymphs Abs 3.7  0.7 - 4.0 K/uL   Monocytes Relative 6  3 - 12 %   Monocytes Absolute 0.7  0.1 - 1.0 K/uL   Eosinophils Relative 0  0 - 5 %   Eosinophils Absolute 0.0  0.0 -  0.7 K/uL   Basophils Relative 0  0 - 1 %   Basophils Absolute 0.0  0.0 - 0.1 K/uL  COMPREHENSIVE METABOLIC PANEL      Component Value Range   Sodium  135  135 - 145 mEq/L   Potassium 3.8  3.5 - 5.1 mEq/L   Chloride 101  96 - 112 mEq/L   CO2 27  19 - 32 mEq/L   Glucose, Bld 78  70 - 99 mg/dL   BUN 11  6 - 23 mg/dL   Creatinine, Ser 1.61  0.50 - 1.10 mg/dL   Calcium 9.3  8.4 - 09.6 mg/dL   Total Protein 6.2  6.0 - 8.3 g/dL   Albumin 3.1 (*) 3.5 - 5.2 g/dL   AST 12  0 - 37 U/L   ALT 12  0 - 35 U/L   Alkaline Phosphatase 73  39 - 117 U/L   Total Bilirubin 0.1 (*) 0.3 - 1.2 mg/dL   GFR calc non Af Amer 83 (*) >90 mL/min   GFR calc Af Amer >90  >90 mL/min    Date: 05/28/2012  2230  Rate: 105  Rhythm: sinus tachycardia  QRS Axis: normal  Intervals: normal  ST/T Wave abnormalities: normal  Conduction Disutrbances: none  Narrative Interpretation: unremarkable  Dg Abd Acute W/chest  05/29/2012  *RADIOLOGY REPORT*  Clinical Data: Crohn's disease, mid abdominal pain  ACUTE ABDOMEN SERIES (ABDOMEN 2 VIEW & CHEST 1 VIEW)  Comparison: CT abdomen pelvis dated 02/06/2012  Findings: Lungs are clear. No pleural effusion or pneumothorax.  Cardiomediastinal silhouette is within normal limits.  Nonobstructive bowel gas pattern.  No evidence of free air under the diaphragm on the upright view.  Cholecystectomy clips.  Calcified pelvic phleboliths.  Visualized osseous structures are within normal limits.  IMPRESSION: No evidence of acute cardiopulmonary disease.  No evidence of small bowel obstruction or free air.   Original Report Authenticated By: Charline Bills, M.D.        MDM  Patient with chest and abdominal pain associated with intermittent vomiting x 2 days. Abdominal pain mainly on the right side. Labs unremarkable. Xray shows only gas ans stool in the colon. Given analgesic and antiemetic with improvement. Dx testing d/w pt. Given a paper copy of film.  Questions answered.  Verb  understanding, agreeable to d/c home with outpt f/u. Pt feels improved after observation and/or treatment in ED.Pt stable in ED with no significant deterioration in condition.The patient appears reasonably screened and/or stabilized for discharge and I doubt any other medical condition or other [facility] requiring further screening, evaluation, or treatment in the ED at this time prior to discharge.  I personally performed the services described in this documentation, which was scribed in my presence. The recorded information has been reviewed and considered.   MDM Reviewed: nursing note and vitals Interpretation: labs and x-ray            Nicoletta Dress. Colon Branch, MD 05/29/12 [phone]

## 2012-05-28 NOTE — ED Notes (Signed)
Pt with crushing feeling to chest, emesis x 2 days, also with abd pain that radiates into groin

## 2012-05-29 LAB — URINALYSIS, ROUTINE W REFLEX MICROSCOPIC
Glucose, UA: NEGATIVE mg/dL
Hgb urine dipstick: NEGATIVE
Specific Gravity, Urine: 1.02 (ref 1.005–1.030)
Urobilinogen, UA: 0.2 mg/dL (ref 0.0–1.0)
pH: 7 (ref 5.0–8.0)

## 2012-05-29 LAB — COMPREHENSIVE METABOLIC PANEL
ALT: 12 U/L (ref 0–35)
AST: 12 U/L (ref 0–37)
Albumin: 3.1 g/dL — ABNORMAL LOW (ref 3.5–5.2)
Calcium: 9.3 mg/dL (ref 8.4–10.5)
Creatinine, Ser: 0.92 mg/dL (ref 0.50–1.10)
GFR calc non Af Amer: 83 mL/min — ABNORMAL LOW (ref >90)
Sodium: 135 mEq/L (ref 135–145)
Total Protein: 6.2 g/dL (ref 6.0–8.3)

## 2012-05-29 LAB — PREGNANCY, URINE: Preg Test, Ur: NEGATIVE

## 2012-05-29 NOTE — ED Notes (Signed)
Pt discharged. Pt stable at time of discharge. pt has no questions regarding discharge at this time. Pt voiced understanding of discharge instructions.  

## 2012-06-03 ENCOUNTER — Encounter (HOSPITAL_COMMUNITY): Payer: Self-pay | Admitting: *Deleted

## 2012-06-03 ENCOUNTER — Emergency Department (HOSPITAL_COMMUNITY)
Admission: EM | Admit: 2012-06-03 | Discharge: 2012-06-03 | Disposition: A | Discharge status: Home / Self Care | Payer: Medicaid Other | Attending: Emergency Medicine | Admitting: Emergency Medicine

## 2012-06-03 DIAGNOSIS — Z880 Allergy status to penicillin: Secondary | ICD-10-CM | POA: Insufficient documentation

## 2012-06-03 DIAGNOSIS — G35 Multiple sclerosis: Secondary | ICD-10-CM | POA: Insufficient documentation

## 2012-06-03 DIAGNOSIS — M549 Dorsalgia, unspecified: Secondary | ICD-10-CM | POA: Insufficient documentation

## 2012-06-03 DIAGNOSIS — K0889 Other specified disorders of teeth and supporting structures: Secondary | ICD-10-CM

## 2012-06-03 DIAGNOSIS — K089 Disorder of teeth and supporting structures, unspecified: Secondary | ICD-10-CM | POA: Insufficient documentation

## 2012-06-03 DIAGNOSIS — Z888 Allergy status to other drugs, medicaments and biological substances status: Secondary | ICD-10-CM | POA: Insufficient documentation

## 2012-06-03 DIAGNOSIS — I10 Essential (primary) hypertension: Secondary | ICD-10-CM | POA: Insufficient documentation

## 2012-06-03 DIAGNOSIS — Z881 Allergy status to other antibiotic agents status: Secondary | ICD-10-CM | POA: Insufficient documentation

## 2012-06-03 DIAGNOSIS — Z9109 Other allergy status, other than to drugs and biological substances: Secondary | ICD-10-CM | POA: Insufficient documentation

## 2012-06-03 DIAGNOSIS — G8929 Other chronic pain: Secondary | ICD-10-CM | POA: Insufficient documentation

## 2012-06-03 HISTORY — DX: Other chronic pain: G89.29

## 2012-06-03 HISTORY — DX: Dorsalgia, unspecified: M54.9

## 2012-06-03 MED ORDER — HYDROCODONE-ACETAMINOPHEN 5-325 MG PO TABS: ORAL_TABLET | ORAL | Status: DC

## 2012-06-03 MED ORDER — OXYCODONE-ACETAMINOPHEN 5-325 MG PO TABS
2.0000 | ORAL_TABLET | Freq: Once | ORAL | Status: AC
  Administered 2012-06-03: 2 via ORAL
  Filled 2012-06-03: qty 2

## 2012-06-03 NOTE — ED Notes (Signed)
Dental pain since Tuesday.  Had oral surgery on Tuesday.

## 2012-06-03 NOTE — ED Provider Notes (Signed)
History     CSN: [id number]  Arrival date & time 06/03/12  1655   First MD Initiated Contact with Patient 06/03/12 1742      Chief Complaint  Patient presents with  . Dental Pain     HPI Pt was seen at 1825.  Per pt, c/o gradual onset and persistence of constant left lower tooth "pain" for the past 3 days.  States the pain began after she had several teeth extracted 3 days ago.  States she is taking tylenol for pain "and it's not helping."  Has been taking her clindamycin as prescribed. Denies fevers, no intra-oral edema, no rash, no facial swelling, no dysphagia, no neck pain.   The condition is aggravated by nothing. The condition is relieved by nothing. The symptoms have been associated with no other complaints.     Past Medical History  Diagnosis Date  . MS (multiple sclerosis)   . Thyroid disease   . Hypertension   . Peptic ulcer   . Crohn's disease   . Migraine   . Chronic back pain     Past Surgical History  Procedure Date  . Cholecystectomy   . Cesarean section      History  Substance Use Topics  . Smoking status: Never Smoker   . Smokeless tobacco: Not on file  . Alcohol Use: No      Review of Systems ROS: Statement: All systems negative except as marked or noted in the HPI; Constitutional: Negative for fever and chills. ; ; Eyes: Negative for eye pain and discharge. ; ; ENMT: Positive for dental caries, dental hygiene poor and toothache. Negative for ear pain, bleeding gums, dental injury, facial deformity, facial swelling, hoarseness, nasal congestion, sinus pressure, sore throat, throat swelling and tongue swollen. ; ; Cardiovascular: Negative for chest pain, palpitations, diaphoresis, dyspnea and peripheral edema. ; ; Respiratory: Negative for cough, wheezing and stridor. ; ; Gastrointestinal: Negative for nausea, vomiting, diarrhea and abdominal pain. ; ; Genitourinary: Negative for dysuria, flank pain and hematuria. ; ; Musculoskeletal: Negative for back  pain and neck pain. ; ; Skin: Negative for rash and skin lesion. ; ; Neuro: Negative for headache, lightheadedness and neck stiffness. ;    Allergies  Prednisone; Aspirin; Nsaids; Penicillins; Ultram; Imitrex; Ketorolac tromethamine; and Other  Home Medications   Current Outpatient Rx  Name Route Sig Dispense Refill  . CYCLOBENZAPRINE HCL 10 MG PO TABS Oral Take 1 tablet (10 mg total) by mouth 3 (three) times daily as needed for muscle spasms. 21 tablet 0  . CYCLOBENZAPRINE HCL 10 MG PO TABS Oral Take 1 tablet (10 mg total) by mouth 3 (three) times daily as needed for muscle spasms. 20 tablet 0  . DICYCLOMINE HCL 10 MG PO CAPS Oral Take 10 mg by mouth 4 (four) times daily.    Marland Kitchen DIPHENHYDRAMINE HCL 25 MG PO CAPS Oral Take 1 capsule (25 mg total) by mouth every 6 (six) hours as needed for itching. 30 capsule 0  . DULOXETINE HCL 60 MG PO CPEP Oral Take 60 mg by mouth daily.    Marland Kitchen FERROUS SULFATE 325 (65 FE) MG PO TABS Oral Take 325 mg by mouth daily.     Marland Kitchen FLUOXETINE HCL 10 MG PO CAPS Oral Take 10 mg by mouth daily.    Marland Kitchen GABAPENTIN 600 MG PO TABS Oral Take 600 mg by mouth 3 (three) times daily.    Marland Kitchen HYDROCODONE-ACETAMINOPHEN 5-325 MG PO TABS Oral Take 2 tablets by mouth every  4 (four) hours as needed for pain. 6 tablet 0  . HYDROCODONE-ACETAMINOPHEN 5-325 MG PO TABS Oral Take 1 tablet by mouth every 4 (four) hours as needed for pain. 15 tablet 0  . INTERFERON BETA-1B 0.3 MG [place] SOLR Subcutaneous Inject 0.25 mg into the skin every other day.     Marland Kitchen LEVOTHYROXINE SODIUM 25 MCG PO TABS Oral Take 25 mcg by mouth daily.    Marland Kitchen METHOCARBAMOL 500 MG PO TABS Oral Take 1 tablet (500 mg total) by mouth 2 (two) times daily. 20 tablet 0  . OMEPRAZOLE 20 MG PO CPDR Oral Take 20 mg by mouth daily.    . QUETIAPINE FUMARATE ER 400 MG PO TB24 Oral Take 800 mg by mouth at bedtime.       BP 122/80  Pulse 87  Temp 98.6 F (37 C) (Oral)  Resp 18  Ht 5\' 5"  (1.651 m)  Wt 275 lb (124.739 kg)  BMI 45.76 kg/m2   SpO2 100%  LMP 05/31/2012  Physical Exam 1830: Physical examination: Vital signs and O2 SAT: Reviewed; Constitutional: Well developed, Well nourished, Well hydrated, In no acute distress; Head and Face: Normocephalic, Atraumatic; Eyes: EOMI, PERRL, No scleral icterus; ENMT: Mouth and pharynx normal, Poor dentition, Widespread dental decay, Left TM normal, Right TM normal, Mucous membranes moist, +upper right 2nd molar with extensive dental decay, +lower left canine with dental decay.  Multiple extracted bilat upper and lower teeth.  No gingival bleeding, erythema, edema, fluctuance, or drainage.  No hoarse voice, no drooling, no stridor.  ; Neck: Supple, Full range of motion, No lymphadenopathy; Cardiovascular: Regular rate and rhythm, No murmur, rub, or gallop; Respiratory: Breath sounds clear & equal bilaterally, No rales, rhonchi, wheezes, Normal respiratory effort/excursion; Chest: Nontender, Movement normal; Extremities: Pulses normal, No tenderness, No edema; Neuro: AA&Ox3, Major CN grossly intact.  No gross focal motor or sensory deficits in extremities.; Skin: Color normal, No rash, No petechiae, Warm, Dry   ED Course  Procedures    MDM  MDM Reviewed: nursing note, vitals and previous chart     1835:  It should be noted that pt has had 24 ED visits in the past 6 months for chronic pain issues, either teeth or low back pain.  Pt encouraged to keep her f/u appointment with her dentist/oral surgeon on Monday for her dental needs for good continuity of care and definitive treatment.  Verb understanding.         Laray Anger, DO 06/06/12 1207

## 2012-06-08 ENCOUNTER — Emergency Department (HOSPITAL_COMMUNITY)
Admission: EM | Admit: 2012-06-08 | Discharge: 2012-06-08 | Discharge status: Against Medical Advice | Payer: Medicaid Other | Attending: Emergency Medicine | Admitting: Emergency Medicine

## 2012-06-08 ENCOUNTER — Encounter (HOSPITAL_COMMUNITY): Payer: Self-pay

## 2012-06-08 DIAGNOSIS — K089 Disorder of teeth and supporting structures, unspecified: Secondary | ICD-10-CM | POA: Insufficient documentation

## 2012-06-08 HISTORY — DX: Dental caries, unspecified: K02.9

## 2012-06-08 NOTE — ED Notes (Signed)
Pt presents with NAD- Pt seen and treated at [facility] 06/03/12 for presenting complaint- pt was given rx for pain- "has run out of meds"- seen here for presenting complaint- need med refill

## 2012-06-27 ENCOUNTER — Encounter (HOSPITAL_COMMUNITY): Payer: Self-pay | Admitting: Family Medicine

## 2012-06-27 ENCOUNTER — Emergency Department (HOSPITAL_COMMUNITY)
Admission: EM | Admit: 2012-06-27 | Discharge: 2012-06-27 | Disposition: A | Discharge status: Home / Self Care | Payer: Medicaid Other | Attending: Emergency Medicine | Admitting: Emergency Medicine

## 2012-06-27 DIAGNOSIS — M545 Low back pain, unspecified: Secondary | ICD-10-CM | POA: Insufficient documentation

## 2012-06-27 DIAGNOSIS — G43909 Migraine, unspecified, not intractable, without status migrainosus: Secondary | ICD-10-CM | POA: Insufficient documentation

## 2012-06-27 DIAGNOSIS — G8929 Other chronic pain: Secondary | ICD-10-CM | POA: Insufficient documentation

## 2012-06-27 DIAGNOSIS — J069 Acute upper respiratory infection, unspecified: Secondary | ICD-10-CM

## 2012-06-27 DIAGNOSIS — K029 Dental caries, unspecified: Secondary | ICD-10-CM | POA: Insufficient documentation

## 2012-06-27 DIAGNOSIS — G35 Multiple sclerosis: Secondary | ICD-10-CM | POA: Insufficient documentation

## 2012-06-27 DIAGNOSIS — K279 Peptic ulcer, site unspecified, unspecified as acute or chronic, without hemorrhage or perforation: Secondary | ICD-10-CM | POA: Insufficient documentation

## 2012-06-27 DIAGNOSIS — I10 Essential (primary) hypertension: Secondary | ICD-10-CM | POA: Insufficient documentation

## 2012-06-27 DIAGNOSIS — K5090 Crohn's disease, unspecified, without complications: Secondary | ICD-10-CM | POA: Insufficient documentation

## 2012-06-27 MED ORDER — HYDROCODONE-ACETAMINOPHEN 5-325 MG PO TABS: 2.0000 | ORAL_TABLET | Freq: Three times a day (TID) | ORAL | Status: DC | PRN

## 2012-06-27 NOTE — ED Notes (Signed)
Pt has had back problems x several years. Hx of "ruptured discs". Supposed to see pain management MD Dec.4.

## 2012-06-27 NOTE — ED Notes (Signed)
Mid back that radiated down right leg after lifting

## 2012-06-27 NOTE — ED Provider Notes (Signed)
History   This chart was scribed for Hurman Horn, MD by Gerlean Ren, ED Scribe. This patient was seen in room TR05C/TR05C and the patient's care was started at 4:35 PM    CSN: [id number]  Arrival date & time 06/27/12  1354   First MD Initiated Contact with Patient 06/27/12 1517      Chief Complaint  Patient presents with  . Back Pain    (Consider location/radiation/quality/duration/timing/severity/associated sxs/prior treatment) The history is provided by the patient. No language interpreter was used.   Jenna Henderson is a 31 y.o. female who presents to the Emergency Department complaining of chronic, sharp lower back pain radiating down right buttocks to all 5 right side toes flaring up suddenly yesterday with associated numbness and weakness in right buttocks and right thigh all baseline for pt.  Pain worsened by movement.  No injury or fall reported as cause of increased pain.  Pt denies any new numbness, weakness, or incontinence.  Pt is currently in the process of setting up pain management with Dr. Gerilyn Pilgrim.   Pt further reports non-productive cough and congestion.  Pt denies associated fever, confusion, and rash.  Pt has h/o MS and HTN.  Pt denies tobacco and alcohol use.   Past Medical History  Diagnosis Date  . MS (multiple sclerosis)   . Thyroid disease   . Hypertension   . Peptic ulcer   . Crohn's disease   . Migraine   . Chronic back pain   . Dental caries     Past Surgical History  Procedure Date  . Cholecystectomy   . Cesarean section   . Mouth surgery     removal of 8 teeth    History reviewed. No pertinent family history.  History  Substance Use Topics  . Smoking status: Never Smoker   . Smokeless tobacco: Not on file  . Alcohol Use: No   No OB history provided.  Review of Systems 10 Systems reviewed and are negative for acute change except as noted in the HPI.  Allergies  Prednisone; Aspirin; Nsaids; Penicillins; Ultram; Imitrex; Ketorolac  tromethamine; and Other  Home Medications   Current Outpatient Rx  Name  Route  Sig  Dispense  Refill  . DICYCLOMINE HCL 10 MG PO CAPS   Oral   Take 10 mg by mouth 4 (four) times daily.         Marland Kitchen DIPHENHYDRAMINE HCL 25 MG PO CAPS   Oral   Take 1 capsule (25 mg total) by mouth every 6 (six) hours as needed for itching.   30 capsule   0   . FERROUS SULFATE 325 (65 FE) MG PO TABS   Oral   Take 325 mg by mouth daily.          Marland Kitchen FLUOXETINE HCL 10 MG PO CAPS   Oral   Take 10 mg by mouth daily.         Marland Kitchen GABAPENTIN 600 MG PO TABS   Oral   Take 600 mg by mouth 3 (three) times daily.         . INTERFERON BETA-1B 0.3 MG [place] SOLR   Subcutaneous   Inject 0.25 mg into the skin every other day.          Marland Kitchen LEVOTHYROXINE SODIUM 25 MCG PO TABS   Oral   Take 25 mcg by mouth daily.         Marland Kitchen OMEPRAZOLE 20 MG PO CPDR   Oral   Take 20  mg by mouth daily.         . QUETIAPINE FUMARATE 200 MG PO TABS   Oral   Take 400 mg by mouth at bedtime.         Marland Kitchen HYDROCODONE-ACETAMINOPHEN 5-325 MG PO TABS   Oral   Take 2 tablets by mouth every 8 (eight) hours as needed for pain.   10 tablet   0     BP 139/87  Pulse 97  Temp 97 F (36.1 C) (Oral)  Resp 16  SpO2 100%  LMP 05/31/2012  Physical Exam  Nursing note and vitals reviewed. Constitutional: She is oriented to person, place, and time.       Awake, alert, nontoxic appearance with baseline speech.  HENT:  Head: Normocephalic and atraumatic.  Mouth/Throat: Oropharynx is clear and moist.       Bilateral nasal mucosa congested with lots of clear discharge. Bilateral TM normal.  Eyes: Pupils are equal, round, and reactive to light. Right eye exhibits no discharge. Left eye exhibits no discharge.  Neck: Neck supple.  Cardiovascular: Normal rate and regular rhythm.   No murmur heard. Pulmonary/Chest: Effort normal and breath sounds normal. No respiratory distress. She has no wheezes. She has no rales. She exhibits no  tenderness.  Abdominal: Soft. Bowel sounds are normal. She exhibits no mass. There is no tenderness. There is no rebound.  Musculoskeletal:       Thoracic back: She exhibits no tenderness.       Lumbar back: She exhibits no tenderness.       Bilateral lower extremities non tender without new rashes or color change, baseline ROM with intact DP pulses, CR<2 secs all digits bilaterally, sensation baseline light touch bilaterally for pt which includes decreased light touch right medial thigh and right lateral buttocks, DTR's symmetric and intact bilaterally KJ / AJ, motor symmetric bilateral 5 / 5 hip flexion, quadriceps, hamstrings, EHL, foot dorsiflexion, foot plantarflexion, gait somewhat antalgic but without apparent new ataxia. Diffuse lumbar and para-lumbar tenderness, greater on the right than left.    Neurological: She is alert and oriented to person, place, and time.       Mental status baseline for patient.  Upper extremity motor strength and sensation intact and symmetric bilaterally.  Skin: No rash noted.  Psychiatric: She has a normal mood and affect.    ED Course  Procedures (including critical care time) DIAGNOSTIC STUDIES: Oxygen Saturation is 100% on room air, normal by my interpretation.    COORDINATION OF CARE: 4:41 PM- Patient informed of clinical course, understands medical decision-making process, and agrees with plan.        Labs Reviewed - No data to display No results found.   1. Chronic low back pain   2. Upper respiratory infection       MDM  I personally performed the services described in this documentation, which was scribed in my presence. The recorded information has been reviewed and is accurate. I doubt any other [facility] precluding discharge at this time including, but not necessarily limited to the following:SBI, cauda equina syndrome.       Hurman Horn, MD 07/01/12 [phone]

## 2012-07-05 ENCOUNTER — Encounter (HOSPITAL_COMMUNITY): Payer: Self-pay | Admitting: Emergency Medicine

## 2012-07-05 ENCOUNTER — Emergency Department (HOSPITAL_COMMUNITY)
Admission: EM | Admit: 2012-07-05 | Discharge: 2012-07-05 | Disposition: A | Discharge status: Home / Self Care | Payer: Medicaid Other | Source: Home / Self Care | Attending: Family Medicine | Admitting: Family Medicine

## 2012-07-05 DIAGNOSIS — K0889 Other specified disorders of teeth and supporting structures: Secondary | ICD-10-CM

## 2012-07-05 DIAGNOSIS — K089 Disorder of teeth and supporting structures, unspecified: Secondary | ICD-10-CM

## 2012-07-05 MED ORDER — CLINDAMYCIN HCL 150 MG PO CAPS: 150.0000 mg | ORAL_CAPSULE | Freq: Four times a day (QID) | ORAL | Status: DC

## 2012-07-05 MED ORDER — HYDROCODONE-ACETAMINOPHEN 5-325 MG PO TABS: 1.0000 | ORAL_TABLET | Freq: Four times a day (QID) | ORAL | Status: DC | PRN

## 2012-07-05 NOTE — ED Provider Notes (Signed)
History     CSN: [id number]  Arrival date & time 07/05/12  1721   First MD Initiated Contact with Patient 07/05/12 1744      Chief Complaint  Patient presents with  . Dental Pain    (Consider location/radiation/quality/duration/timing/severity/associated sxs/prior treatment) Patient is a 31 y.o. female presenting with tooth pain. The history is provided by the patient.  Dental PainThe primary symptoms include mouth pain. The symptoms began 2 days ago. The symptoms are worsening. The symptoms are recurrent.  Additional symptoms include: dental sensitivity to temperature and jaw pain. Additional symptoms do not include: facial swelling. Medical issues include: periodontal disease. Associated medical issues comments: pt reports an appt 12/6 with oral surgeon in [place] re extractions..    Past Medical History  Diagnosis Date  . MS (multiple sclerosis)   . Thyroid disease   . Hypertension   . Peptic ulcer   . Crohn's disease   . Migraine   . Chronic back pain   . Dental caries     Past Surgical History  Procedure Date  . Cholecystectomy   . Cesarean section   . Mouth surgery     removal of 8 teeth    History reviewed. No pertinent family history.  History  Substance Use Topics  . Smoking status: Never Smoker   . Smokeless tobacco: Not on file  . Alcohol Use: No    OB History    Grav Para Term Preterm Abortions TAB SAB Ect Mult Living            3      Review of Systems  Constitutional: Negative.   HENT: Positive for dental problem. Negative for facial swelling.     Allergies  Prednisone; Aspirin; Nsaids; Penicillins; Ultram; Imitrex; Ketorolac tromethamine; and Other  Home Medications   Current Outpatient Rx  Name  Route  Sig  Dispense  Refill  . CLINDAMYCIN HCL 150 MG PO CAPS   Oral   Take 1 capsule (150 mg total) by mouth 4 (four) times daily.   28 capsule   0   . DICYCLOMINE HCL 10 MG PO CAPS   Oral   Take 10 mg by mouth 4 (four) times  daily.         Marland Kitchen DIPHENHYDRAMINE HCL 25 MG PO CAPS   Oral   Take 1 capsule (25 mg total) by mouth every 6 (six) hours as needed for itching.   30 capsule   0   . FERROUS SULFATE 325 (65 FE) MG PO TABS   Oral   Take 325 mg by mouth daily.          Marland Kitchen FLUOXETINE HCL 10 MG PO CAPS   Oral   Take 10 mg by mouth daily.         Marland Kitchen GABAPENTIN 600 MG PO TABS   Oral   Take 600 mg by mouth 3 (three) times daily.         Marland Kitchen HYDROCODONE-ACETAMINOPHEN 5-325 MG PO TABS   Oral   Take 2 tablets by mouth every 8 (eight) hours as needed for pain.   10 tablet   0   . HYDROCODONE-ACETAMINOPHEN 5-325 MG PO TABS   Oral   Take 1 tablet by mouth every 6 (six) hours as needed for pain.   10 tablet   0   . INTERFERON BETA-1B 0.3 MG [place] SOLR   Subcutaneous   Inject 0.25 mg into the skin every other day.          Marland Kitchen  LEVOTHYROXINE SODIUM 25 MCG PO TABS   Oral   Take 25 mcg by mouth daily.         Marland Kitchen OMEPRAZOLE 20 MG PO CPDR   Oral   Take 20 mg by mouth daily.         . QUETIAPINE FUMARATE 200 MG PO TABS   Oral   Take 400 mg by mouth at bedtime.           BP 118/66  Pulse 78  Temp 98.1 F (36.7 C) (Oral)  Resp 18  SpO2 99%  Physical Exam  Nursing note and vitals reviewed. Constitutional: She is oriented to person, place, and time. She appears well-developed and well-nourished. No distress.  HENT:  Head: Normocephalic.  Right Ear: External ear normal.  Left Ear: External ear normal.  Mouth/Throat: Oropharynx is clear and moist.    Lymphadenopathy:    She has cervical adenopathy.  Neurological: She is alert and oriented to person, place, and time.  Skin: Skin is warm and dry.    ED Course  Procedures (including critical care time)  Labs Reviewed - No data to display No results found.   1. Pain, dental       MDM          Linna Hoff, MD 07/05/12 [phone]

## 2012-07-05 NOTE — ED Notes (Signed)
Reports front teeth pain (top and bottom teeth).  States she does have appointment with oral surgeon next week.  Patient states the tooth pain has face and neck hurting.

## 2012-07-10 ENCOUNTER — Emergency Department (HOSPITAL_COMMUNITY)
Admission: EM | Admit: 2012-07-10 | Discharge: 2012-07-10 | Disposition: A | Discharge status: Home / Self Care | Payer: Medicaid Other | Attending: Emergency Medicine | Admitting: Emergency Medicine

## 2012-07-10 ENCOUNTER — Encounter (HOSPITAL_COMMUNITY): Payer: Self-pay | Admitting: *Deleted

## 2012-07-10 DIAGNOSIS — I10 Essential (primary) hypertension: Secondary | ICD-10-CM | POA: Insufficient documentation

## 2012-07-10 DIAGNOSIS — E079 Disorder of thyroid, unspecified: Secondary | ICD-10-CM | POA: Insufficient documentation

## 2012-07-10 DIAGNOSIS — G43909 Migraine, unspecified, not intractable, without status migrainosus: Secondary | ICD-10-CM | POA: Insufficient documentation

## 2012-07-10 DIAGNOSIS — G35 Multiple sclerosis: Secondary | ICD-10-CM | POA: Insufficient documentation

## 2012-07-10 DIAGNOSIS — Z8711 Personal history of peptic ulcer disease: Secondary | ICD-10-CM | POA: Insufficient documentation

## 2012-07-10 DIAGNOSIS — K047 Periapical abscess without sinus: Secondary | ICD-10-CM | POA: Insufficient documentation

## 2012-07-10 DIAGNOSIS — Z79899 Other long term (current) drug therapy: Secondary | ICD-10-CM | POA: Insufficient documentation

## 2012-07-10 MED ORDER — OXYCODONE-ACETAMINOPHEN 5-325 MG PO TABS: 1.0000 | ORAL_TABLET | ORAL | Status: DC | PRN

## 2012-07-10 MED ORDER — ERYTHROMYCIN BASE 250 MG PO TABS: 250.0000 mg | ORAL_TABLET | Freq: Four times a day (QID) | ORAL | Status: DC

## 2012-07-10 MED ORDER — OXYCODONE-ACETAMINOPHEN 5-325 MG PO TABS
1.0000 | ORAL_TABLET | Freq: Once | ORAL | Status: AC
  Administered 2012-07-10: 1 via ORAL
  Filled 2012-07-10: qty 1

## 2012-07-10 NOTE — ED Provider Notes (Signed)
History     CSN: [id number]  Arrival date & time 07/10/12  1337   First MD Initiated Contact with Patient 07/10/12 1446      Chief Complaint  Patient presents with  . Dental Pain    (Consider location/radiation/quality/duration/timing/severity/associated sxs/prior treatment) HPI Comments: Jenna Henderson presents with a 7 day history of dental pain and gingival swelling.   She is in the process of getting all of her teeth pulled in anticipation of dentures and sees her oral surgeon next on 07/22/12 for removal of her remaining upper teeth.  She has developed increasing pain and and swelling above her left upper central incisor.  There has been no fevers, chills, gingival drainage or difficulty swallowing.  She was seen for the same complaint last week and was prescribed clindamycin which she did not get filled as she states the last time she took this medicine it gave her terrible diarrhea.  Chewing makes pain worse.  The patient has tried tylenol without relief of symptoms.      The history is provided by the patient.    Past Medical History  Diagnosis Date  . MS (multiple sclerosis)   . Thyroid disease   . Hypertension   . Peptic ulcer   . Crohn's disease   . Migraine   . Chronic back pain   . Dental caries     Past Surgical History  Procedure Date  . Cholecystectomy   . Cesarean section   . Mouth surgery     removal of 8 teeth    History reviewed. No pertinent family history.  History  Substance Use Topics  . Smoking status: Never Smoker   . Smokeless tobacco: Not on file  . Alcohol Use: No    OB History    Grav Para Term Preterm Abortions TAB SAB Ect Mult Living            3      Review of Systems  Constitutional: Negative for fever.  HENT: Positive for dental problem. Negative for sore throat, facial swelling, neck pain and neck stiffness.   Respiratory: Negative for shortness of breath.     Allergies  Prednisone; Aspirin; Nsaids; Penicillins; Ultram;  Imitrex; Ketorolac tromethamine; and Other  Home Medications   Current Outpatient Rx  Name  Route  Sig  Dispense  Refill  . ALPRAZOLAM 1 MG PO TABS   Oral   Take 1 mg by mouth 3 (three) times daily.         Marland Kitchen DICYCLOMINE HCL 10 MG PO CAPS   Oral   Take 10 mg by mouth 4 (four) times daily.         Marland Kitchen DIPHENHYDRAMINE HCL 25 MG PO CAPS   Oral   Take 1 capsule (25 mg total) by mouth every 6 (six) hours as needed for itching.   30 capsule   0   . FERROUS SULFATE 325 (65 FE) MG PO TABS   Oral   Take 325 mg by mouth daily.          Marland Kitchen FLUOXETINE HCL 10 MG PO CAPS   Oral   Take 10 mg by mouth daily.         Marland Kitchen GABAPENTIN 600 MG PO TABS   Oral   Take 600 mg by mouth 3 (three) times daily.         . INTERFERON BETA-1B 0.3 MG [place] SOLR   Subcutaneous   Inject 0.25 mg into the skin every other day.          Marland Kitchen  LEVOTHYROXINE SODIUM 25 MCG PO TABS   Oral   Take 25 mcg by mouth daily.         Marland Kitchen OMEPRAZOLE 20 MG PO CPDR   Oral   Take 20 mg by mouth daily.         . QUETIAPINE FUMARATE 400 MG PO TABS   Oral   Take 400 mg by mouth at bedtime.         . ERYTHROMYCIN BASE 250 MG PO TABS   Oral   Take 1 tablet (250 mg total) by mouth every 6 (six) hours.   28 tablet   0   . OXYCODONE-ACETAMINOPHEN 5-325 MG PO TABS   Oral   Take 1 tablet by mouth every 4 (four) hours as needed for pain.   20 tablet   0     BP 132/71  Pulse 100  Temp 98 F (36.7 C) (Oral)  Resp 19  SpO2 100%  LMP 06/29/2012  Physical Exam  Constitutional: She is oriented to person, place, and time. She appears well-developed and well-nourished. No distress.  HENT:  Head: Normocephalic and atraumatic. No trismus in the jaw.  Right Ear: Tympanic membrane and external ear normal.  Left Ear: Tympanic membrane and external ear normal.  Mouth/Throat: Oropharynx is clear and moist and mucous membranes are normal. No oral lesions. Dental abscesses present.       Generalized poor dentition,  partially edentulous.  Gingival edema and erythema above left upper central incisor with no obvious fracture or decay at this tooth site.  Eyes: Conjunctivae normal are normal.  Neck: Normal range of motion. Neck supple.  Cardiovascular: Normal rate and normal heart sounds.   Pulmonary/Chest: Effort normal.  Abdominal: She exhibits no distension.  Musculoskeletal: Normal range of motion.  Lymphadenopathy:    She has no cervical adenopathy.  Neurological: She is alert and oriented to person, place, and time.  Skin: Skin is warm and dry. No erythema.  Psychiatric: She has a normal mood and affect.    ED Course  Procedures (including critical care time)  Labs Reviewed - No data to display No results found.   1. Dental abscess       MDM  Pt placed on erythromycin given other allergies and intolerance to clindamycin.  Encouraged eating yogurt while on this medication.  Oxycodone.  F/u with oral surgeon as scheduled.        Burgess Amor, [place] 07/10/12 2221

## 2012-07-10 NOTE — ED Notes (Signed)
Pt c/o L sided toothache. Has plans to remove teeth for dentures. H/a and ear pain to Left

## 2012-07-11 NOTE — ED Provider Notes (Signed)
Medical screening examination/treatment/procedure(s) were performed by non-physician practitioner and as supervising physician I was immediately available for consultation/collaboration.   Ravinder Lukehart L Ardine Iacovelli, MD 07/11/12 1308 

## 2012-07-16 ENCOUNTER — Encounter (HOSPITAL_COMMUNITY): Payer: Self-pay

## 2012-07-16 ENCOUNTER — Emergency Department (HOSPITAL_COMMUNITY)
Admission: EM | Admit: 2012-07-16 | Discharge: 2012-07-16 | Disposition: A | Discharge status: Home / Self Care | Payer: Medicaid Other | Attending: Emergency Medicine | Admitting: Emergency Medicine

## 2012-07-16 ENCOUNTER — Emergency Department (HOSPITAL_COMMUNITY): Payer: Medicaid Other

## 2012-07-16 DIAGNOSIS — W11XXXA Fall on and from ladder, initial encounter: Secondary | ICD-10-CM | POA: Insufficient documentation

## 2012-07-16 DIAGNOSIS — Z79899 Other long term (current) drug therapy: Secondary | ICD-10-CM | POA: Insufficient documentation

## 2012-07-16 DIAGNOSIS — Z8679 Personal history of other diseases of the circulatory system: Secondary | ICD-10-CM | POA: Insufficient documentation

## 2012-07-16 DIAGNOSIS — I10 Essential (primary) hypertension: Secondary | ICD-10-CM | POA: Insufficient documentation

## 2012-07-16 DIAGNOSIS — G8929 Other chronic pain: Secondary | ICD-10-CM | POA: Insufficient documentation

## 2012-07-16 DIAGNOSIS — R109 Unspecified abdominal pain: Secondary | ICD-10-CM | POA: Insufficient documentation

## 2012-07-16 DIAGNOSIS — Y9389 Activity, other specified: Secondary | ICD-10-CM | POA: Insufficient documentation

## 2012-07-16 DIAGNOSIS — Z8711 Personal history of peptic ulcer disease: Secondary | ICD-10-CM | POA: Insufficient documentation

## 2012-07-16 DIAGNOSIS — E079 Disorder of thyroid, unspecified: Secondary | ICD-10-CM | POA: Insufficient documentation

## 2012-07-16 DIAGNOSIS — G35 Multiple sclerosis: Secondary | ICD-10-CM | POA: Insufficient documentation

## 2012-07-16 DIAGNOSIS — S46919A Strain of unspecified muscle, fascia and tendon at shoulder and upper arm level, unspecified arm, initial encounter: Secondary | ICD-10-CM

## 2012-07-16 DIAGNOSIS — IMO0002 Reserved for concepts with insufficient information to code with codable children: Secondary | ICD-10-CM | POA: Insufficient documentation

## 2012-07-16 DIAGNOSIS — M549 Dorsalgia, unspecified: Secondary | ICD-10-CM | POA: Insufficient documentation

## 2012-07-16 DIAGNOSIS — Y9289 Other specified places as the place of occurrence of the external cause: Secondary | ICD-10-CM | POA: Insufficient documentation

## 2012-07-16 DIAGNOSIS — K5090 Crohn's disease, unspecified, without complications: Secondary | ICD-10-CM | POA: Insufficient documentation

## 2012-07-16 MED ORDER — HYDROCODONE-ACETAMINOPHEN 7.5-325 MG PO TABS: 1.0000 | ORAL_TABLET | ORAL | Status: DC | PRN

## 2012-07-16 MED ORDER — HYDROCODONE-ACETAMINOPHEN 5-325 MG PO TABS
2.0000 | ORAL_TABLET | Freq: Once | ORAL | Status: AC
  Administered 2012-07-16: 2 via ORAL
  Filled 2012-07-16: qty 2

## 2012-07-16 MED ORDER — METHOCARBAMOL 500 MG PO TABS: ORAL_TABLET | ORAL | Status: DC

## 2012-07-16 MED ORDER — DIAZEPAM 5 MG PO TABS
5.0000 mg | ORAL_TABLET | Freq: Once | ORAL | Status: AC
  Administered 2012-07-16: 5 mg via ORAL
  Filled 2012-07-16: qty 1

## 2012-07-16 NOTE — ED Provider Notes (Signed)
History     CSN: [id number]  Arrival date & time 07/16/12  1639   First MD Initiated Contact with Patient 07/16/12 1843      Chief Complaint  Patient presents with  . Shoulder Pain  . Back Pain    (Consider location/radiation/quality/duration/timing/severity/associated sxs/prior treatment) Patient is a 31 y.o. female presenting with shoulder pain and back pain. The history is provided by the patient.  Shoulder Pain This is a new problem. The current episode started in the past 7 days. The problem occurs daily. The problem has been gradually worsening. Associated symptoms include abdominal pain, arthralgias and myalgias. Pertinent negatives include no chest pain, coughing or neck pain. Exacerbated by: movement and certain positions. She has tried nothing for the symptoms. The treatment provided no relief.  Back Pain  Associated symptoms include abdominal pain. Pertinent negatives include no chest pain and no dysuria.    Past Medical History  Diagnosis Date  . MS (multiple sclerosis)   . Thyroid disease   . Hypertension   . Peptic ulcer   . Crohn's disease   . Migraine   . Chronic back pain   . Dental caries     Past Surgical History  Procedure Date  . Cholecystectomy   . Cesarean section   . Mouth surgery     removal of 8 teeth    No family history on file.  History  Substance Use Topics  . Smoking status: Never Smoker   . Smokeless tobacco: Not on file  . Alcohol Use: No    OB History    Grav Para Term Preterm Abortions TAB SAB Ect Mult Living            3      Review of Systems  Constitutional: Negative for activity change.       All ROS Neg except as noted in HPI  HENT: Negative for nosebleeds and neck pain.   Eyes: Negative for photophobia and discharge.  Respiratory: Negative for cough, shortness of breath and wheezing.   Cardiovascular: Negative for chest pain and palpitations.  Gastrointestinal: Positive for abdominal pain. Negative for blood in  stool.  Genitourinary: Negative for dysuria, frequency and hematuria.  Musculoskeletal: Positive for myalgias, back pain and arthralgias.  Skin: Negative.   Neurological: Negative for dizziness, seizures and speech difficulty.  Psychiatric/Behavioral: Negative for hallucinations and confusion.    Allergies  Prednisone; Aspirin; Nsaids; Penicillins; Ultram; Imitrex; Ketorolac tromethamine; and Other  Home Medications   Current Outpatient Rx  Name  Route  Sig  Dispense  Refill  . ALPRAZOLAM 1 MG PO TABS   Oral   Take 1 mg by mouth 3 (three) times daily.         Marland Kitchen DICYCLOMINE HCL 10 MG PO CAPS   Oral   Take 10 mg by mouth 4 (four) times daily.         Marland Kitchen DIPHENHYDRAMINE HCL 25 MG PO CAPS   Oral   Take 1 capsule (25 mg total) by mouth every 6 (six) hours as needed for itching.   30 capsule   0   . ERYTHROMYCIN BASE 250 MG PO TABS   Oral   Take 1 tablet (250 mg total) by mouth every 6 (six) hours.   28 tablet   0   . FERROUS SULFATE 325 (65 FE) MG PO TABS   Oral   Take 325 mg by mouth daily.          Marland Kitchen FLUOXETINE HCL 10  MG PO CAPS   Oral   Take 10 mg by mouth daily.         Marland Kitchen GABAPENTIN 600 MG PO TABS   Oral   Take 600 mg by mouth 3 (three) times daily.         . INTERFERON BETA-1B 0.3 MG  SOLR   Subcutaneous   Inject 0.25 mg into the skin every other day.          Marland Kitchen LEVOTHYROXINE SODIUM 25 MCG PO TABS   Oral   Take 25 mcg by mouth daily.         Marland Kitchen OMEPRAZOLE 20 MG PO CPDR   Oral   Take 20 mg by mouth daily.         . OXYCODONE-ACETAMINOPHEN 5-325 MG PO TABS   Oral   Take 1 tablet by mouth every 4 (four) hours as needed for pain.   20 tablet   0   . QUETIAPINE FUMARATE 400 MG PO TABS   Oral   Take 400 mg by mouth at bedtime.           BP 125/85  Pulse 76  Temp 98.3 F (36.8 C) (Oral)  Resp 20  SpO2 100%  LMP 06/29/2012  Physical Exam  Nursing note and vitals reviewed. Constitutional: She is oriented to person, place, and  time. She appears well-developed and well-nourished.  Non-toxic appearance.  HENT:  Head: Normocephalic.  Right Ear: Tympanic membrane and external ear normal.  Left Ear: Tympanic membrane and external ear normal.  Eyes: EOM and lids are normal. Pupils are equal, round, and reactive to light.  Neck: Normal range of motion. Neck supple. Carotid bruit is not present.  Cardiovascular: Normal rate, regular rhythm, normal heart sounds, intact distal pulses and normal pulses.   Pulmonary/Chest: Breath sounds normal. No respiratory distress.  Abdominal: Soft. Bowel sounds are normal. There is no tenderness. There is no guarding.  Musculoskeletal: Normal range of motion.       FROM of the fingers and wrist of the RUE. FROM right elbow. No humerus deformity.Soreness with ROM of the right shoulder. No clavicle deformity, but some pain to palpation. Paraspinal tenderness with attempted ROM of the lower back. No step off. No bruising.  Lymphadenopathy:       Head (right side): No submandibular adenopathy present.       Head (left side): No submandibular adenopathy present.    She has no cervical adenopathy.  Neurological: She is alert and oriented to person, place, and time. She has normal strength. No cranial nerve deficit or sensory deficit.  Skin: Skin is warm and dry.  Psychiatric: She has a normal mood and affect. Her speech is normal.    ED Course  Procedures (including critical care time)  Labs Reviewed - No data to display No results found.   No diagnosis found.    MDM  I have reviewed nursing notes, vital signs, and all appropriate lab and imaging results for this patient. Right shoulder xray is negative for fx or dislocation. Pt is ambulatory with soreness. Rx for robaxin and norco 7.5mg  given to the patient. Pt to see her primary MD or return to the ED if any changes.       Kathie Dike, PA 07/21/12 0848  Kathie Dike, PA 07/21/12 [phone]

## 2012-07-16 NOTE — ED Notes (Signed)
Pt reports falling on Thursday, injured right shoulder and low back.
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
## 2012-07-17 ENCOUNTER — Emergency Department (HOSPITAL_COMMUNITY): Payer: No Typology Code available for payment source

## 2012-07-17 ENCOUNTER — Emergency Department (HOSPITAL_COMMUNITY)
Admission: EM | Admit: 2012-07-17 | Discharge: 2012-07-17 | Disposition: A | Discharge status: Home / Self Care | Payer: No Typology Code available for payment source | Attending: Emergency Medicine | Admitting: Emergency Medicine

## 2012-07-17 ENCOUNTER — Encounter (HOSPITAL_COMMUNITY): Payer: Self-pay | Admitting: *Deleted

## 2012-07-17 DIAGNOSIS — Y9389 Activity, other specified: Secondary | ICD-10-CM | POA: Insufficient documentation

## 2012-07-17 DIAGNOSIS — Z8669 Personal history of other diseases of the nervous system and sense organs: Secondary | ICD-10-CM | POA: Insufficient documentation

## 2012-07-17 DIAGNOSIS — S81009A Unspecified open wound, unspecified knee, initial encounter: Secondary | ICD-10-CM | POA: Insufficient documentation

## 2012-07-17 DIAGNOSIS — E079 Disorder of thyroid, unspecified: Secondary | ICD-10-CM | POA: Insufficient documentation

## 2012-07-17 DIAGNOSIS — Z792 Long term (current) use of antibiotics: Secondary | ICD-10-CM | POA: Insufficient documentation

## 2012-07-17 DIAGNOSIS — Z8711 Personal history of peptic ulcer disease: Secondary | ICD-10-CM | POA: Insufficient documentation

## 2012-07-17 DIAGNOSIS — IMO0002 Reserved for concepts with insufficient information to code with codable children: Secondary | ICD-10-CM

## 2012-07-17 DIAGNOSIS — Z79899 Other long term (current) drug therapy: Secondary | ICD-10-CM | POA: Insufficient documentation

## 2012-07-17 DIAGNOSIS — I10 Essential (primary) hypertension: Secondary | ICD-10-CM | POA: Insufficient documentation

## 2012-07-17 DIAGNOSIS — M549 Dorsalgia, unspecified: Secondary | ICD-10-CM | POA: Insufficient documentation

## 2012-07-17 DIAGNOSIS — G35 Multiple sclerosis: Secondary | ICD-10-CM | POA: Insufficient documentation

## 2012-07-17 DIAGNOSIS — Y92410 Unspecified street and highway as the place of occurrence of the external cause: Secondary | ICD-10-CM | POA: Insufficient documentation

## 2012-07-17 DIAGNOSIS — G8929 Other chronic pain: Secondary | ICD-10-CM | POA: Insufficient documentation

## 2012-07-17 DIAGNOSIS — Z8719 Personal history of other diseases of the digestive system: Secondary | ICD-10-CM | POA: Insufficient documentation

## 2012-07-17 MED ORDER — OXYCODONE-ACETAMINOPHEN 5-325 MG PO TABS
1.0000 | ORAL_TABLET | Freq: Once | ORAL | Status: AC
  Administered 2012-07-17: 1 via ORAL
  Filled 2012-07-17: qty 1

## 2012-07-17 MED ORDER — ONDANSETRON 4 MG PO TBDP
8.0000 mg | ORAL_TABLET | Freq: Once | ORAL | Status: AC
  Administered 2012-07-17: 8 mg via ORAL
  Filled 2012-07-17: qty 2

## 2012-07-17 MED ORDER — ACETAMINOPHEN 325 MG PO TABS
650.0000 mg | ORAL_TABLET | Freq: Once | ORAL | Status: AC
  Administered 2012-07-17: 650 mg via ORAL
  Filled 2012-07-17: qty 2
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
## 2012-07-19 NOTE — ED Provider Notes (Signed)
Medical screening examination/treatment/procedure(s) were performed by non-physician practitioner and as supervising physician I was immediately available for consultation/collaboration.  Idonna Heeren R Yadiel Aubry, MD 07/19/12 2309 

## 2012-07-20 ENCOUNTER — Encounter (HOSPITAL_COMMUNITY): Payer: Self-pay | Admitting: *Deleted

## 2012-07-20 ENCOUNTER — Emergency Department (HOSPITAL_COMMUNITY): Payer: No Typology Code available for payment source

## 2012-07-20 ENCOUNTER — Emergency Department (HOSPITAL_COMMUNITY)
Admission: EM | Admit: 2012-07-20 | Discharge: 2012-07-20 | Disposition: A | Discharge status: Home / Self Care | Payer: No Typology Code available for payment source | Attending: Emergency Medicine | Admitting: Emergency Medicine

## 2012-07-20 DIAGNOSIS — K5090 Crohn's disease, unspecified, without complications: Secondary | ICD-10-CM | POA: Insufficient documentation

## 2012-07-20 DIAGNOSIS — Y9389 Activity, other specified: Secondary | ICD-10-CM | POA: Insufficient documentation

## 2012-07-20 DIAGNOSIS — Z79899 Other long term (current) drug therapy: Secondary | ICD-10-CM | POA: Insufficient documentation

## 2012-07-20 DIAGNOSIS — Z8639 Personal history of other endocrine, nutritional and metabolic disease: Secondary | ICD-10-CM | POA: Insufficient documentation

## 2012-07-20 DIAGNOSIS — Z3202 Encounter for pregnancy test, result negative: Secondary | ICD-10-CM | POA: Insufficient documentation

## 2012-07-20 DIAGNOSIS — Z8669 Personal history of other diseases of the nervous system and sense organs: Secondary | ICD-10-CM | POA: Insufficient documentation

## 2012-07-20 DIAGNOSIS — K279 Peptic ulcer, site unspecified, unspecified as acute or chronic, without hemorrhage or perforation: Secondary | ICD-10-CM | POA: Insufficient documentation

## 2012-07-20 DIAGNOSIS — G8929 Other chronic pain: Secondary | ICD-10-CM | POA: Insufficient documentation

## 2012-07-20 DIAGNOSIS — M549 Dorsalgia, unspecified: Secondary | ICD-10-CM | POA: Insufficient documentation

## 2012-07-20 DIAGNOSIS — S39012A Strain of muscle, fascia and tendon of lower back, initial encounter: Secondary | ICD-10-CM

## 2012-07-20 DIAGNOSIS — S335XXA Sprain of ligaments of lumbar spine, initial encounter: Secondary | ICD-10-CM | POA: Insufficient documentation

## 2012-07-20 DIAGNOSIS — I10 Essential (primary) hypertension: Secondary | ICD-10-CM | POA: Insufficient documentation

## 2012-07-20 DIAGNOSIS — T07XXXA Unspecified multiple injuries, initial encounter: Secondary | ICD-10-CM | POA: Insufficient documentation

## 2012-07-20 DIAGNOSIS — Z862 Personal history of diseases of the blood and blood-forming organs and certain disorders involving the immune mechanism: Secondary | ICD-10-CM | POA: Insufficient documentation

## 2012-07-20 LAB — POCT PREGNANCY, URINE: Preg Test, Ur: NEGATIVE

## 2012-07-20 MED ORDER — METHOCARBAMOL 500 MG PO TABS
1000.0000 mg | ORAL_TABLET | Freq: Once | ORAL | Status: AC
  Administered 2012-07-20: 1000 mg via ORAL
  Filled 2012-07-20: qty 2

## 2012-07-20 MED ORDER — OXYCODONE-ACETAMINOPHEN 5-325 MG PO TABS
2.0000 | ORAL_TABLET | Freq: Once | ORAL | Status: AC
  Administered 2012-07-20: 2 via ORAL
  Filled 2012-07-20: qty 2

## 2012-07-20 MED ORDER — METHOCARBAMOL 500 MG PO TABS: 1000.0000 mg | ORAL_TABLET | Freq: Four times a day (QID) | ORAL | Status: AC

## 2012-07-20 MED ORDER — OXYCODONE-ACETAMINOPHEN 5-325 MG PO TABS: 1.0000 | ORAL_TABLET | ORAL | Status: AC | PRN
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
## 2012-07-20 NOTE — ED Notes (Signed)
Pt resting in bed, back from radiology at this time.

## 2012-07-21 NOTE — ED Provider Notes (Signed)
Medical screening examination/treatment/procedure(s) were performed by non-physician practitioner and as supervising physician I was immediately available for consultation/collaboration.  Jones Skene, M.D.     Jones Skene, MD 07/21/12 [phone]
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
## 2012-07-24 NOTE — ED Provider Notes (Signed)
Medical screening examination/treatment/procedure(s) were performed by non-physician practitioner and as supervising physician I was immediately available for consultation/collaboration.   Joya Gaskins, MD 07/24/12 1729

## 2012-11-12 ENCOUNTER — Emergency Department (HOSPITAL_COMMUNITY)
Admission: EM | Admit: 2012-11-12 | Discharge: 2012-11-12 | Disposition: A | Discharge status: Home / Self Care | Payer: Medicaid Other | Attending: Emergency Medicine | Admitting: Emergency Medicine

## 2012-11-12 ENCOUNTER — Encounter (HOSPITAL_COMMUNITY): Payer: Self-pay | Admitting: *Deleted

## 2012-11-12 DIAGNOSIS — R109 Unspecified abdominal pain: Secondary | ICD-10-CM | POA: Insufficient documentation

## 2012-11-12 DIAGNOSIS — R51 Headache: Secondary | ICD-10-CM | POA: Insufficient documentation

## 2012-11-12 DIAGNOSIS — M549 Dorsalgia, unspecified: Secondary | ICD-10-CM

## 2012-11-12 DIAGNOSIS — K5090 Crohn's disease, unspecified, without complications: Secondary | ICD-10-CM | POA: Insufficient documentation

## 2012-11-12 DIAGNOSIS — Z8679 Personal history of other diseases of the circulatory system: Secondary | ICD-10-CM | POA: Insufficient documentation

## 2012-11-12 DIAGNOSIS — Z880 Allergy status to penicillin: Secondary | ICD-10-CM | POA: Insufficient documentation

## 2012-11-12 DIAGNOSIS — G8929 Other chronic pain: Secondary | ICD-10-CM | POA: Insufficient documentation

## 2012-11-12 DIAGNOSIS — R000 Tachycardia, unspecified: Secondary | ICD-10-CM | POA: Insufficient documentation

## 2012-11-12 DIAGNOSIS — Z862 Personal history of diseases of the blood and blood-forming organs and certain disorders involving the immune mechanism: Secondary | ICD-10-CM | POA: Insufficient documentation

## 2012-11-12 DIAGNOSIS — Z8639 Personal history of other endocrine, nutritional and metabolic disease: Secondary | ICD-10-CM | POA: Insufficient documentation

## 2012-11-12 DIAGNOSIS — I10 Essential (primary) hypertension: Secondary | ICD-10-CM | POA: Insufficient documentation

## 2012-11-12 DIAGNOSIS — G35 Multiple sclerosis: Secondary | ICD-10-CM | POA: Insufficient documentation

## 2012-11-12 MED ORDER — CYCLOBENZAPRINE HCL 10 MG PO TABS
10.0000 mg | ORAL_TABLET | Freq: Once | ORAL | Status: AC
  Administered 2012-11-12: 10 mg via ORAL
  Filled 2012-11-12: qty 1

## 2012-11-12 MED ORDER — HYDROCODONE-ACETAMINOPHEN 5-325 MG PO TABS
2.0000 | ORAL_TABLET | Freq: Once | ORAL | Status: AC
  Administered 2012-11-12: 2 via ORAL
  Filled 2012-11-12: qty 2

## 2012-11-12 MED ORDER — ONDANSETRON HCL 4 MG PO TABS
4.0000 mg | ORAL_TABLET | Freq: Once | ORAL | Status: AC
  Administered 2012-11-12: 4 mg via ORAL
  Filled 2012-11-12: qty 1

## 2012-11-12 MED ORDER — CYCLOBENZAPRINE HCL 10 MG PO TABS: 10.0000 mg | ORAL_TABLET | Freq: Three times a day (TID) | ORAL | Status: DC | PRN

## 2012-11-12 MED ORDER — HYDROCODONE-ACETAMINOPHEN 10-325 MG PO TABS: 1.0000 | ORAL_TABLET | Freq: Four times a day (QID) | ORAL | Status: DC | PRN

## 2012-11-12 NOTE — ED Provider Notes (Signed)
History     CSN: [id number]  Arrival date & time 11/12/12  1511   First MD Initiated Contact with Patient 11/12/12 1617      Chief Complaint  Patient presents with  . Back Pain    (Consider location/radiation/quality/duration/timing/severity/associated sxs/prior treatment) Patient is a 32 y.o. female presenting with back pain. The history is provided by the patient.  Back Pain Location:  Lumbar spine Quality:  Aching and cramping Pain severity:  Moderate Pain is:  Same all the time Onset quality:  Sudden Duration:  1 day Timing:  Intermittent Progression:  Worsening Chronicity:  New Context: lifting heavy objects   Context: not recent illness   Relieved by:  Nothing Worsened by:  Movement and twisting Ineffective treatments:  None tried Associated symptoms: abdominal pain and headaches   Associated symptoms: no bladder incontinence, no bowel incontinence, no chest pain, no dysuria and no perianal numbness     Past Medical History  Diagnosis Date  . MS (multiple sclerosis)   . Thyroid disease   . Hypertension   . Peptic ulcer   . Crohn's disease   . Migraine   . Chronic back pain   . Dental caries     Past Surgical History  Procedure Laterality Date  . Cholecystectomy    . Cesarean section    . Mouth surgery      removal of 8 teeth    No family history on file.  History  Substance Use Topics  . Smoking status: Never Smoker   . Smokeless tobacco: Not on file  . Alcohol Use: No    OB History   Grav Para Term Preterm Abortions TAB SAB Ect Mult Living            3      Review of Systems  Constitutional: Negative for activity change.       All ROS Neg except as noted in HPI  HENT: Positive for dental problem. Negative for nosebleeds and neck pain.   Eyes: Negative for photophobia and discharge.  Respiratory: Negative for cough, shortness of breath and wheezing.   Cardiovascular: Negative for chest pain and palpitations.  Gastrointestinal:  Positive for abdominal pain. Negative for blood in stool and bowel incontinence.  Genitourinary: Negative for bladder incontinence, dysuria, frequency and hematuria.  Musculoskeletal: Positive for back pain. Negative for arthralgias.  Skin: Negative.   Neurological: Positive for headaches. Negative for dizziness, seizures and speech difficulty.  Psychiatric/Behavioral: Negative for hallucinations and confusion.    Allergies  Prednisone; Aspirin; Nsaids; Penicillins; Ultram; Imitrex; Ketorolac tromethamine; and Other  Home Medications   Current Outpatient Rx  Name  Route  Sig  Dispense  Refill  . ALPRAZolam (XANAX) 1 MG tablet   Oral   Take 1 mg by mouth 3 (three) times daily.         Marland Kitchen dicyclomine (BENTYL) 10 MG capsule   Oral   Take 10 mg by mouth 3 (three) times daily.          . interferon beta-1b (BETASERON) 0.3 MG injection   Subcutaneous   Inject 0.25 mg into the skin every other day.          . methocarbamol (ROBAXIN) 500 MG tablet   Oral   Take 500 mg by mouth 3 (three) times daily as needed. For spasm/pain         . omeprazole (PRILOSEC) 20 MG capsule   Oral   Take 20 mg by mouth daily.         Marland Kitchen  QUEtiapine (SEROQUEL) 400 MG tablet   Oral   Take 400 mg by mouth at bedtime.           BP 112/75  Pulse 101  Temp(Src) 98.1 F (36.7 C) (Oral)  Resp 18  Ht 5\' 5"  (1.651 m)  Wt 268 lb (121.564 kg)  BMI 44.6 kg/m2  SpO2 90%  LMP 11/09/2012  Physical Exam  Nursing note and vitals reviewed. Constitutional: She is oriented to person, place, and time. She appears well-developed and well-nourished.  Non-toxic appearance.  HENT:  Head: Normocephalic.  Right Ear: Tympanic membrane and external ear normal.  Left Ear: Tympanic membrane and external ear normal.  Eyes: EOM and lids are normal. Pupils are equal, round, and reactive to light.  Neck: Normal range of motion. Neck supple. Carotid bruit is not present.  Cardiovascular: Regular rhythm, normal  heart sounds, intact distal pulses and normal pulses.  Tachycardia present.   Pulmonary/Chest: Breath sounds normal. No respiratory distress.  Symmetrical rise and fall of the chest. Rhonchi present, but on focal areas on auscultation.  Abdominal: Soft. Bowel sounds are normal. There is no tenderness. There is no guarding.  Musculoskeletal: Normal range of motion.  Lower thoracic and upper and lower lumbar area pain to palpation and  Lumbar paraspinal spasm palpated. No palpable step off.  Lymphadenopathy:       Head (right side): No submandibular adenopathy present.       Head (left side): No submandibular adenopathy present.    She has no cervical adenopathy.  Neurological: She is alert and oriented to person, place, and time. She has normal strength. No cranial nerve deficit or sensory deficit.  Skin: Skin is warm and dry.  Psychiatric: She has a normal mood and affect. Her speech is normal.    ED Course  Procedures (including critical care time)  Labs Reviewed - No data to display No results found.   No diagnosis found.    MDM  I have reviewed nursing notes, vital signs, and all appropriate lab and imaging results for this patient. Hx of MS and chronic back problem. Suspect acute on chronic back pain. Rx for norco and flexeril given to the patient.       Kathie Dike, PA-C 11/17/12 1326
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
## 2012-11-23 NOTE — ED Provider Notes (Signed)
Medical screening examination/treatment/procedure(s) were performed by non-physician practitioner and as supervising physician I was immediately available for consultation/collaboration.  Raeford Razor, MD 11/23/12 [phone]

## 2012-12-06 ENCOUNTER — Emergency Department (HOSPITAL_COMMUNITY)
Admission: EM | Admit: 2012-12-06 | Discharge: 2012-12-06 | Disposition: A | Discharge status: Home / Self Care | Payer: Medicaid Other | Attending: Emergency Medicine | Admitting: Emergency Medicine

## 2012-12-06 ENCOUNTER — Encounter (HOSPITAL_COMMUNITY): Payer: Self-pay | Admitting: *Deleted

## 2012-12-06 DIAGNOSIS — Z8719 Personal history of other diseases of the digestive system: Secondary | ICD-10-CM | POA: Insufficient documentation

## 2012-12-06 DIAGNOSIS — G8929 Other chronic pain: Secondary | ICD-10-CM | POA: Insufficient documentation

## 2012-12-06 DIAGNOSIS — M545 Low back pain, unspecified: Secondary | ICD-10-CM | POA: Insufficient documentation

## 2012-12-06 DIAGNOSIS — Z8679 Personal history of other diseases of the circulatory system: Secondary | ICD-10-CM | POA: Insufficient documentation

## 2012-12-06 DIAGNOSIS — R350 Frequency of micturition: Secondary | ICD-10-CM | POA: Insufficient documentation

## 2012-12-06 DIAGNOSIS — Z8669 Personal history of other diseases of the nervous system and sense organs: Secondary | ICD-10-CM | POA: Insufficient documentation

## 2012-12-06 DIAGNOSIS — I10 Essential (primary) hypertension: Secondary | ICD-10-CM | POA: Insufficient documentation

## 2012-12-06 DIAGNOSIS — E079 Disorder of thyroid, unspecified: Secondary | ICD-10-CM | POA: Insufficient documentation

## 2012-12-06 DIAGNOSIS — Z87440 Personal history of urinary (tract) infections: Secondary | ICD-10-CM | POA: Insufficient documentation

## 2012-12-06 DIAGNOSIS — Z8711 Personal history of peptic ulcer disease: Secondary | ICD-10-CM | POA: Insufficient documentation

## 2012-12-06 DIAGNOSIS — Z79899 Other long term (current) drug therapy: Secondary | ICD-10-CM | POA: Insufficient documentation

## 2012-12-06 HISTORY — DX: Urinary tract infection, site not specified: N39.0

## 2012-12-06 LAB — URINE MICROSCOPIC-ADD ON

## 2012-12-06 LAB — URINALYSIS, ROUTINE W REFLEX MICROSCOPIC
Glucose, UA: NEGATIVE mg/dL
Leukocytes, UA: NEGATIVE
Nitrite: NEGATIVE
Protein, ur: 30 mg/dL — AB
Specific Gravity, Urine: >1.03 — ABNORMAL HIGH (ref 1.005–1.030)
Urobilinogen, UA: 0.2 mg/dL (ref 0.0–1.0)
pH: 6 (ref 5.0–8.0)

## 2012-12-06 MED ORDER — OXYCODONE-ACETAMINOPHEN 5-325 MG PO TABS
1.0000 | ORAL_TABLET | Freq: Once | ORAL | Status: AC
  Administered 2012-12-06: 1 via ORAL
  Filled 2012-12-06: qty 1

## 2012-12-06 MED ORDER — HYDROCODONE-ACETAMINOPHEN 5-325 MG PO TABS: 1.0000 | ORAL_TABLET | ORAL | Status: DC | PRN

## 2012-12-06 MED ORDER — CYCLOBENZAPRINE HCL 10 MG PO TABS: 10.0000 mg | ORAL_TABLET | Freq: Two times a day (BID) | ORAL | Status: DC | PRN

## 2012-12-06 NOTE — ED Provider Notes (Signed)
Medical screening examination/treatment/procedure(s) were performed by non-physician practitioner and as supervising physician I was immediately available for consultation/collaboration.    Nelia Shi, MD 12/06/12 1524
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
## 2012-12-06 NOTE — ED Notes (Signed)
Pt with continued to have back pain and urine frequency, has finished antibiotics on 17th for UTI

## 2012-12-07 LAB — URINE CULTURE: Culture: NO GROWTH

## 2012-12-09 ENCOUNTER — Encounter (HOSPITAL_COMMUNITY): Payer: Self-pay | Admitting: *Deleted

## 2012-12-09 ENCOUNTER — Emergency Department (HOSPITAL_COMMUNITY)
Admission: EM | Admit: 2012-12-09 | Discharge: 2012-12-09 | Disposition: A | Discharge status: Home / Self Care | Payer: Medicaid Other | Attending: Emergency Medicine | Admitting: Emergency Medicine

## 2012-12-09 DIAGNOSIS — X500XXA Overexertion from strenuous movement or load, initial encounter: Secondary | ICD-10-CM | POA: Insufficient documentation

## 2012-12-09 DIAGNOSIS — Z8719 Personal history of other diseases of the digestive system: Secondary | ICD-10-CM | POA: Insufficient documentation

## 2012-12-09 DIAGNOSIS — G43909 Migraine, unspecified, not intractable, without status migrainosus: Secondary | ICD-10-CM | POA: Insufficient documentation

## 2012-12-09 DIAGNOSIS — K279 Peptic ulcer, site unspecified, unspecified as acute or chronic, without hemorrhage or perforation: Secondary | ICD-10-CM | POA: Insufficient documentation

## 2012-12-09 DIAGNOSIS — Y9389 Activity, other specified: Secondary | ICD-10-CM | POA: Insufficient documentation

## 2012-12-09 DIAGNOSIS — Z87440 Personal history of urinary (tract) infections: Secondary | ICD-10-CM | POA: Insufficient documentation

## 2012-12-09 DIAGNOSIS — IMO0002 Reserved for concepts with insufficient information to code with codable children: Secondary | ICD-10-CM | POA: Insufficient documentation

## 2012-12-09 DIAGNOSIS — Z8639 Personal history of other endocrine, nutritional and metabolic disease: Secondary | ICD-10-CM | POA: Insufficient documentation

## 2012-12-09 DIAGNOSIS — Z79899 Other long term (current) drug therapy: Secondary | ICD-10-CM | POA: Insufficient documentation

## 2012-12-09 DIAGNOSIS — Y929 Unspecified place or not applicable: Secondary | ICD-10-CM | POA: Insufficient documentation

## 2012-12-09 DIAGNOSIS — G35 Multiple sclerosis: Secondary | ICD-10-CM | POA: Insufficient documentation

## 2012-12-09 DIAGNOSIS — I10 Essential (primary) hypertension: Secondary | ICD-10-CM | POA: Insufficient documentation

## 2012-12-09 DIAGNOSIS — Z880 Allergy status to penicillin: Secondary | ICD-10-CM | POA: Insufficient documentation

## 2012-12-09 DIAGNOSIS — Z862 Personal history of diseases of the blood and blood-forming organs and certain disorders involving the immune mechanism: Secondary | ICD-10-CM | POA: Insufficient documentation

## 2012-12-09 DIAGNOSIS — M5416 Radiculopathy, lumbar region: Secondary | ICD-10-CM

## 2012-12-09 MED ORDER — HYDROMORPHONE HCL PF 1 MG/ML IJ SOLN
1.0000 mg | Freq: Once | INTRAMUSCULAR | Status: AC
  Administered 2012-12-09: 1 mg via INTRAMUSCULAR
  Filled 2012-12-09: qty 1

## 2012-12-09 MED ORDER — OXYCODONE-ACETAMINOPHEN 5-325 MG PO TABS: 1.0000 | ORAL_TABLET | ORAL | Status: DC | PRN

## 2012-12-09 MED ORDER — METHOCARBAMOL 500 MG PO TABS: ORAL_TABLET | ORAL | Status: DC
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
## 2012-12-09 NOTE — ED Notes (Signed)
Pt was able to walk to w/c

## 2012-12-09 NOTE — ED Provider Notes (Signed)
History     CSN: [id number]  Arrival date & time 12/09/12  1501   First MD Initiated Contact with Patient 12/09/12 1514      Chief Complaint  Patient presents with  . Back Pain    (Consider location/radiation/quality/duration/timing/severity/associated sxs/prior treatment) HPI Comments: Patient with a history of acute on chronic low back pain presents to the emergency room with worsening pain to her right lower back. She states pain became worse yesterday after moving some heavy boxes. She describes the pain as sharp and radiating into her right buttock and right thigh. Pain is worse with ambulation and sitting. Pain improves somewhat with lying supine. She was recently seen here and treated for back pain and urinary tract infection and she states the UTI symptoms have improved. She denies incontinence of bowel or bladder, fever, dysuria, abdominal pain or vomiting. She states the pain is similar to previous low back pain and states the medication she was prescribed previously has not helped her pain.  Her she also states that she has an appointment with [facility] in [place] for Dec 19, 2012  Patient is a 32 y.o. female presenting with back pain. The history is provided by the patient.  Back Pain Location:  Lumbar spine Quality:  Aching Radiates to:  R posterior upper leg and R thigh Pain severity:  Severe Pain is:  Same all the time Onset quality:  Gradual Timing:  Constant Progression:  Worsening Chronicity:  Chronic Context: lifting heavy objects and twisting   Relieved by:  Nothing Worsened by:  Ambulation, bending, standing, movement, twisting and sitting Ineffective treatments:  Muscle relaxants and narcotics Associated symptoms: leg pain   Associated symptoms: no abdominal pain, no abdominal swelling, no bladder incontinence, no bowel incontinence, no chest pain, no dysuria, no fever, no headaches, no numbness, no paresthesias, no pelvic pain, no perianal numbness,  no tingling and no weakness     Past Medical History  Diagnosis Date  . MS (multiple sclerosis)   . Thyroid disease   . Hypertension   . Peptic ulcer   . Crohn's disease   . Migraine   . Chronic back pain   . Dental caries   . UTI (lower urinary tract infection)     Past Surgical History  Procedure Laterality Date  . Cholecystectomy    . Cesarean section    . Mouth surgery      removal of 8 teeth  . Tubal ligation      History reviewed. No pertinent family history.  History  Substance Use Topics  . Smoking status: Never Smoker   . Smokeless tobacco: Not on file  . Alcohol Use: No    OB History   Grav Para Term Preterm Abortions TAB SAB Ect Mult Living            3      Review of Systems  Constitutional: Negative for fever.  Respiratory: Negative for shortness of breath.   Cardiovascular: Negative for chest pain.  Gastrointestinal: Negative for vomiting, abdominal pain, constipation and bowel incontinence.  Genitourinary: Negative for bladder incontinence, dysuria, hematuria, flank pain, decreased urine volume, difficulty urinating and pelvic pain.       No perineal numbness or incontinence of urine or feces  Musculoskeletal: Positive for back pain. Negative for joint swelling.  Skin: Negative for rash.  Neurological: Negative for tingling, weakness, numbness, headaches and paresthesias.  All other systems reviewed and are negative.    Allergies  Prednisone; Aspirin;  Nsaids; Penicillins; Ultram; Imitrex; Ketorolac tromethamine; and Other  Home Medications   Current Outpatient Rx  Name  Route  Sig  Dispense  Refill  . acetaminophen (TYLENOL) 500 MG tablet   Oral   Take 1,000 mg by mouth every 6 (six) hours as needed for pain.         Marland Kitchen ALPRAZolam (XANAX) 1 MG tablet   Oral   Take 1 mg by mouth 3 (three) times daily.         . ciprofloxacin (CIPRO) 500 MG tablet   Oral   Take 500 mg by mouth 2 (two) times daily.         . cyclobenzaprine  (FLEXERIL) 10 MG tablet   Oral   Take 1 tablet (10 mg total) by mouth 2 (two) times daily as needed for muscle spasms.   20 tablet   0   . dicyclomine (BENTYL) 10 MG capsule   Oral   Take 10 mg by mouth 3 (three) times daily.          Marland Kitchen omeprazole (PRILOSEC) 20 MG capsule   Oral   Take 20 mg by mouth daily.         . methocarbamol (ROBAXIN) 500 MG tablet      Two tabs po TID x 7 days   42 tablet   0   . oxyCODONE-acetaminophen (PERCOCET/ROXICET) 5-325 MG per tablet   Oral   Take 1 tablet by mouth every 4 (four) hours as needed for pain.   15 tablet   0     BP 119/87  Pulse 96  Temp(Src) 98.3 F (36.8 C) (Oral)  Resp 20  Ht 5\' 5"  (1.651 m)  Wt 268 lb (121.564 kg)  BMI 44.6 kg/m2  SpO2 100%  LMP 12/05/2012  Physical Exam  Nursing note and vitals reviewed. Constitutional: She is oriented to person, place, and time. She appears well-developed and well-nourished. No distress.  HENT:  Head: Normocephalic and atraumatic.  Neck: Normal range of motion. Neck supple.  Cardiovascular: Normal rate, regular rhythm and intact distal pulses.   No murmur heard. Pulmonary/Chest: Effort normal and breath sounds normal.  Musculoskeletal: She exhibits tenderness. She exhibits no edema.       Lumbar back: She exhibits tenderness and pain. She exhibits normal range of motion, no swelling, no deformity, no laceration and normal pulse.       Back:  ttp of the right lumbar paraspinal muscles and SI joint.  Dp pulses brisk and symmetrical.  Distal sensation intact.  Hip flexors/extensors intact.  No calf pain or edema.  Neurological: She is alert and oriented to person, place, and time. No cranial nerve deficit or sensory deficit. She exhibits normal muscle tone. Coordination and gait normal.  Reflex Scores:      Patellar reflexes are 2+ on the right side and 2+ on the left side.      Achilles reflexes are 2+ on the right side and 2+ on the left side. Skin: Skin is warm and dry.     ED Course  Procedures (including critical care time)  Labs Reviewed - No data to display No results found.   1. Lumbar radicular pain       MDM     Patient seen here recently for low back pain, had U/A and negative urine culture from 12/06/12. Previous ED charts reviewed.   Patient has hx of chronic low back pain and frequent ED visits for same.  pai today is similar  to previous.    Patient reviewed on the [place] and  narcotics database with no recent narcotics.  Patient has ttp of the right lumbar paraspinal muscles.  Pain also reproduced with SLR on right.  No focal neuro or sensory deficits on exam.  Ambulates with a steady gait.  Has a copy of an MRI done at another facility and has an appt with [facility] on Dec 19, 2012.  Doubt emergent neurological or infectious process.    Pain is feeling better after injection, ambulates to the restroom.  Pain improved and appears stable for discharge.         Stevens Magwood L. Trisha Mangle, PA-C 12/09/12 1723

## 2012-12-09 NOTE — ED Provider Notes (Signed)
Medical screening examination/treatment/procedure(s) were performed by non-physician practitioner and as supervising physician I was immediately available for consultation/collaboration.  Flint Melter, MD 12/09/12 [phone]

## 2012-12-09 NOTE — ED Notes (Signed)
Moving boxes when moving in with her sister yesterday, and felt a pop in her back.

## 2012-12-14 IMAGING — CT CT L SPINE W/O CM
3 of 8 series · 11 of 33 positions shown, 13 images · non-contrast
Comparison: Lumbar radiographs 04/14/2012

CLINICAL DATA: Back pain.  Numbness and tingling in legs.

CT LUMBAR SPINE WITHOUT CONTRAST
TECHNIQUE: Multidetector CT imaging of the lumbar spine was
performed without intravenous contrast administration. Multiplanar
CT image reconstructions were also generated.

[Series 3: lumbar spine 2.0 b30s · axial · 0.43mm/px · z∈[-301,-173]mm · 3 of 128 slices shown, 4 images]
[im 32/128  soft-tissue]
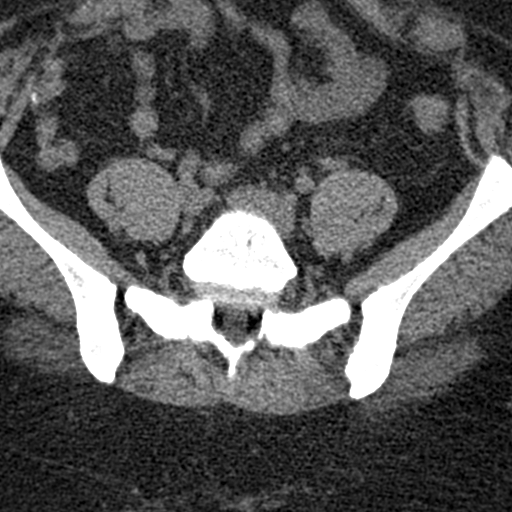
[im 32/128  bone]
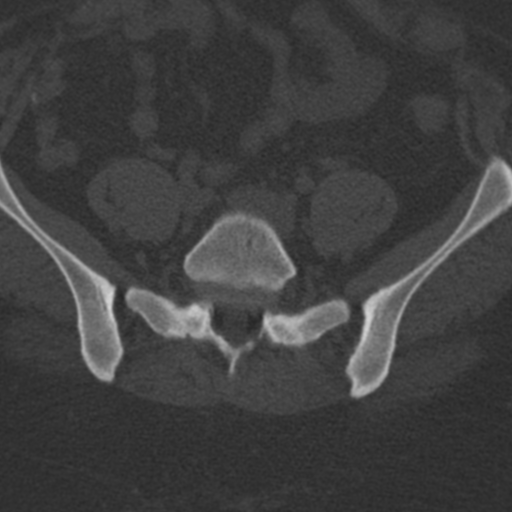
[im 64/128  bone]
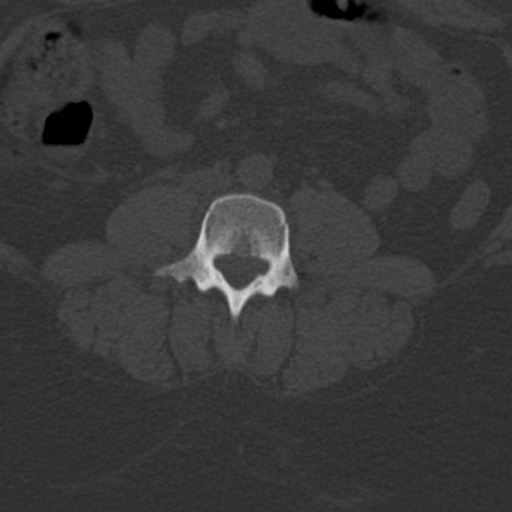
[im 96/128  bone]
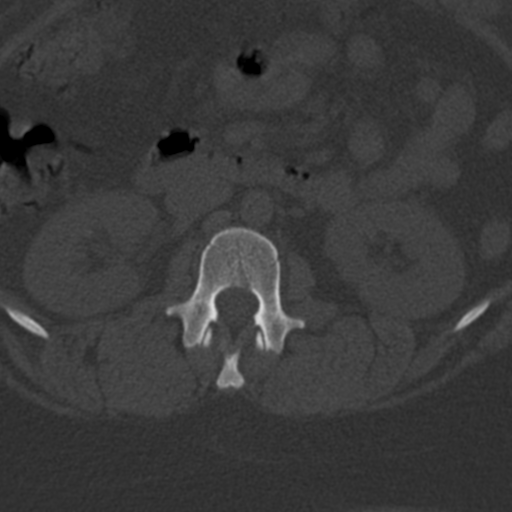

[Series 4: lumbar spine 2.0 spo cor · coronal · 0.26mm/px · 3 of 73 slices shown]
[im 15/73  bone]
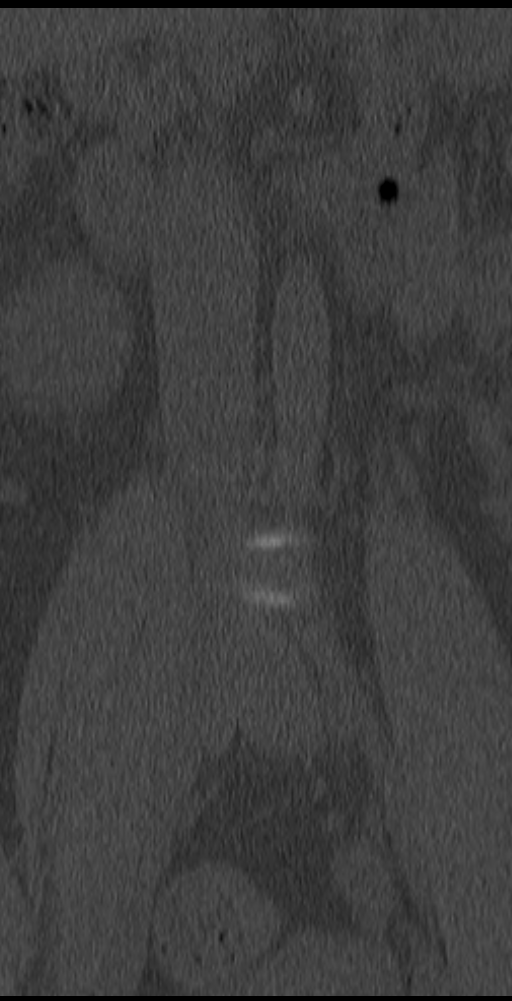
[im 29/73  bone]
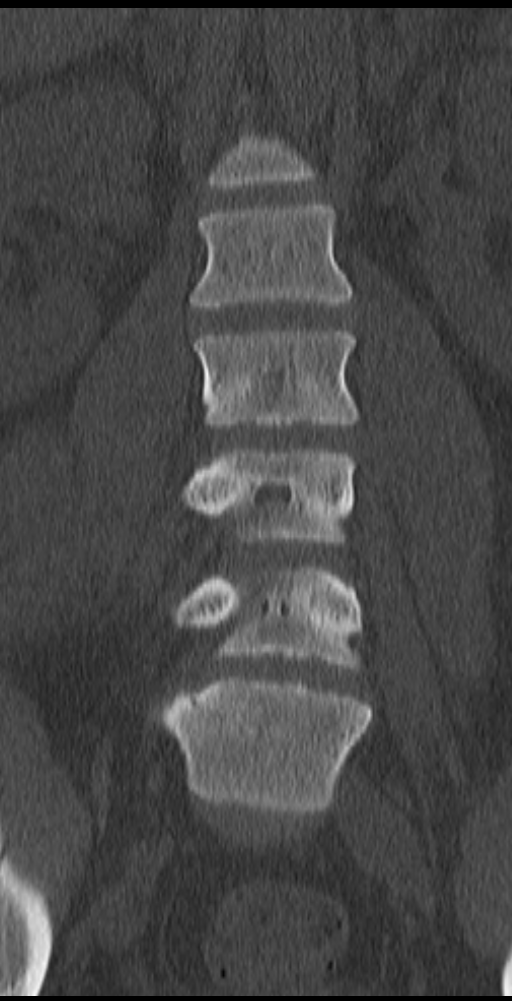
[im 44/73  bone]
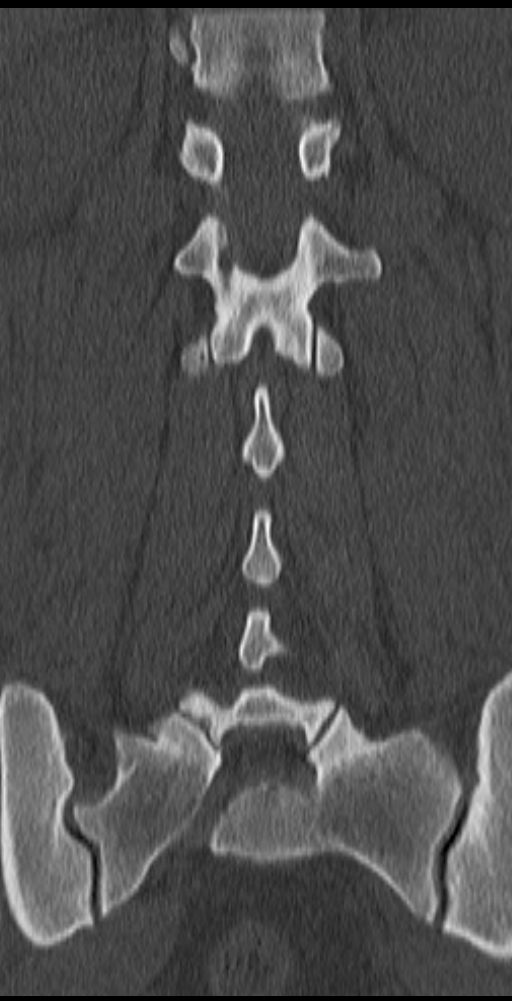

[Series 5: lumbar spine 2.0 spo · sagittal · 0.29mm/px · 5 of 66 slices shown, 6 images]
[im 22/66  bone]
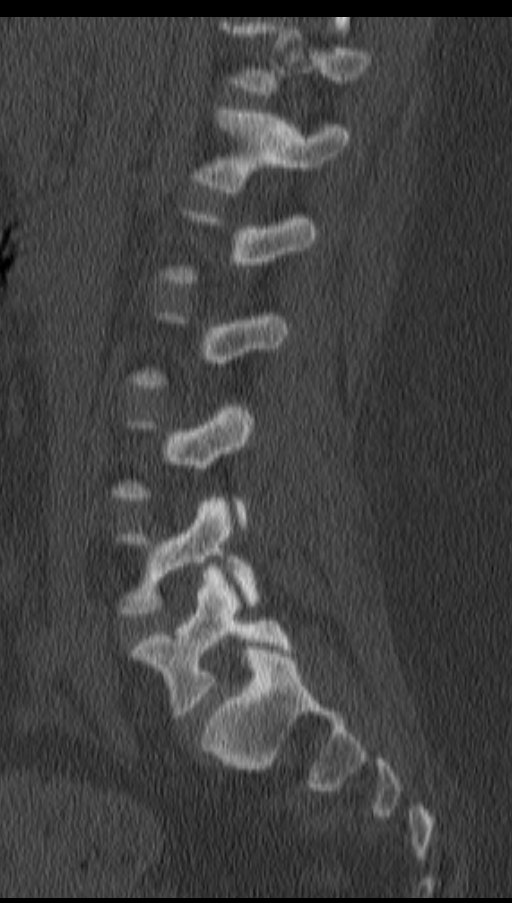
[im 28/66  bone]
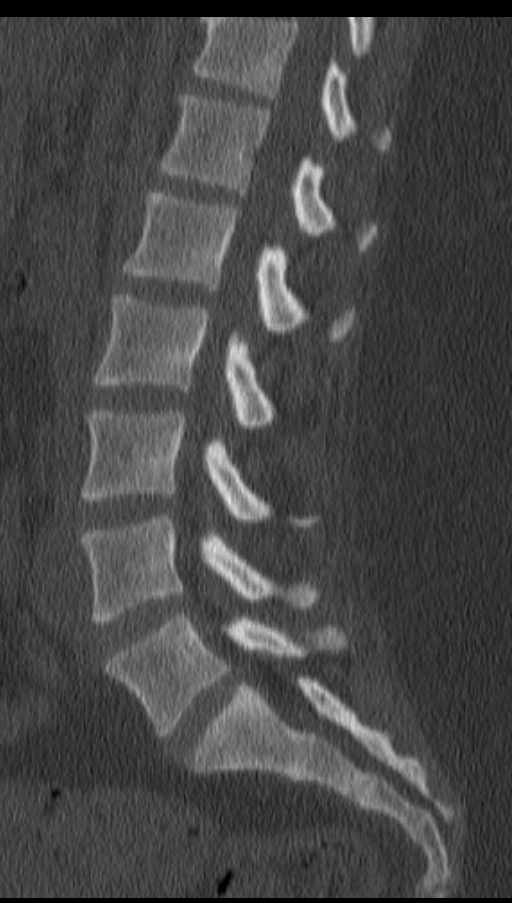
[im 33/66  soft-tissue]
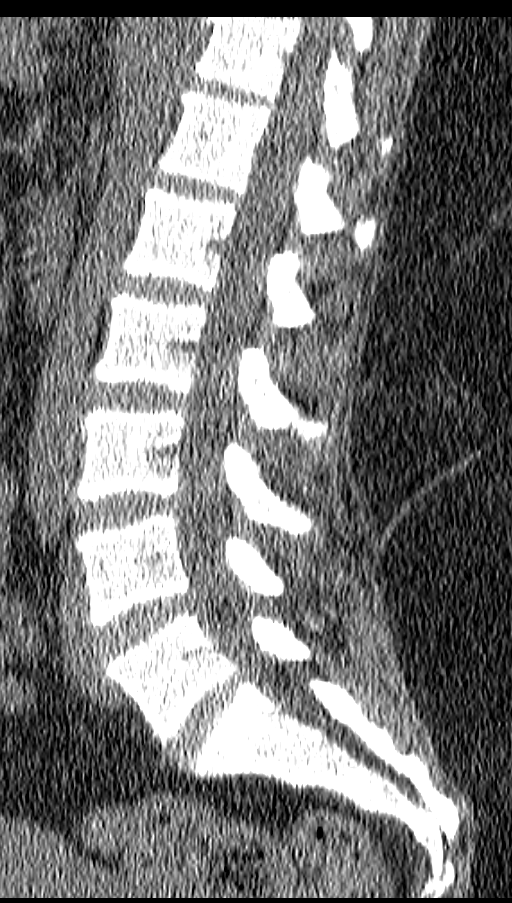
[im 33/66  bone]
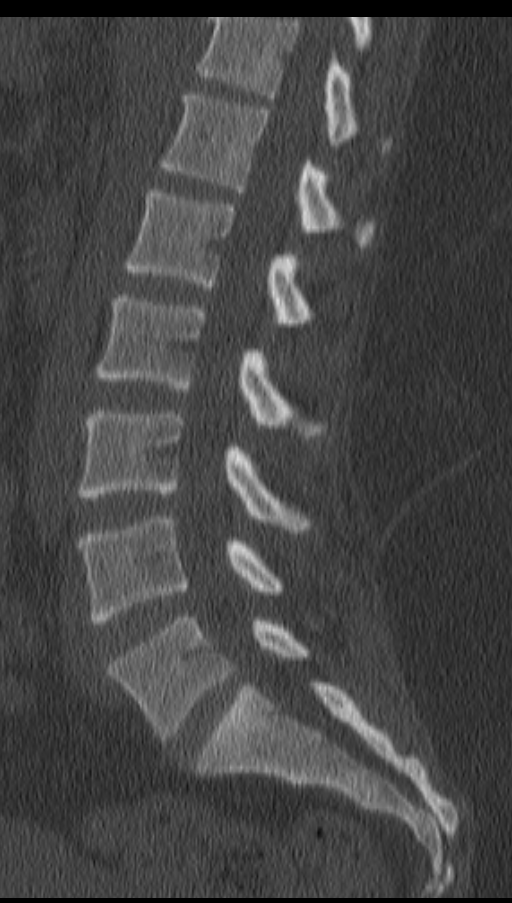
[im 38/66  bone]
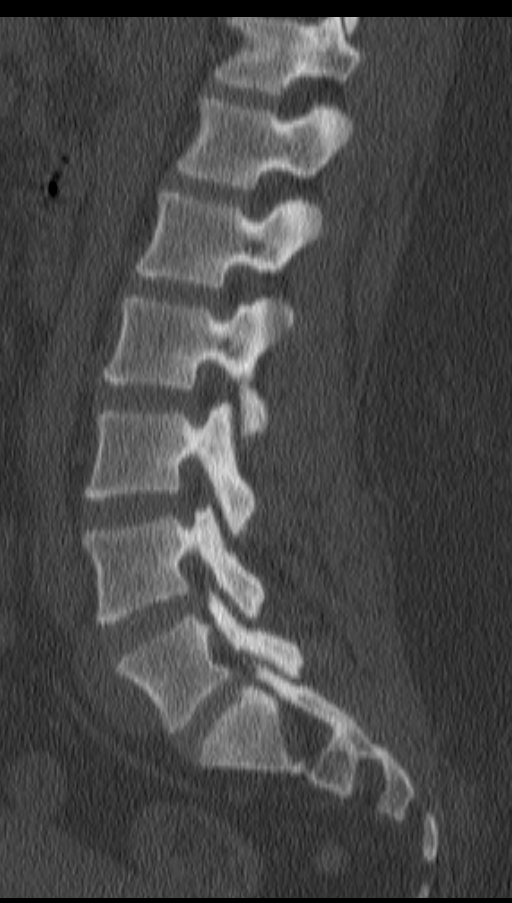
[im 44/66  bone]
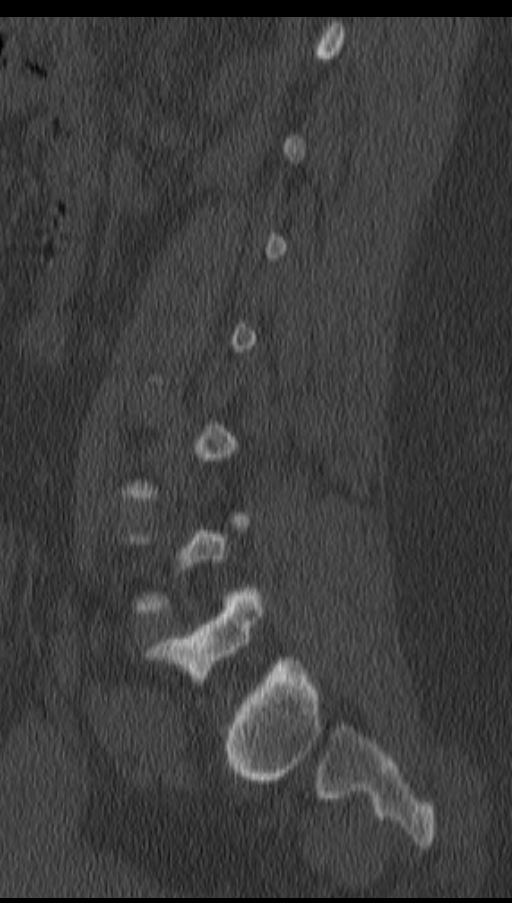

[11 of 33 positions shown; findings below may reference images not displayed]

FINDINGS: Rudimentary rib on the left at T12.  This places the
lumbosacral junction at L5-S1.

2 mm posterior slip L4-5.  Negative for fracture or mass.  No acute
bony abnormality.

No disc protrusion or significant spinal stenosis.  Mild disc
bulging at L3-4 and L4-5.  Mild facet degeneration at L4-5.
IMPRESSION: Mild lumbar degenerative changes.  Negative for disc protrusion or
spinal stenosis.  No fracture or acute bony abnormality.

## 2013-01-10 ENCOUNTER — Emergency Department (HOSPITAL_COMMUNITY)
Admission: EM | Admit: 2013-01-10 | Discharge: 2013-01-10 | Disposition: A | Discharge status: Home / Self Care | Payer: Medicaid Other | Attending: Emergency Medicine | Admitting: Emergency Medicine

## 2013-01-10 ENCOUNTER — Encounter (HOSPITAL_COMMUNITY): Payer: Self-pay

## 2013-01-10 DIAGNOSIS — Z8639 Personal history of other endocrine, nutritional and metabolic disease: Secondary | ICD-10-CM | POA: Insufficient documentation

## 2013-01-10 DIAGNOSIS — Z79899 Other long term (current) drug therapy: Secondary | ICD-10-CM | POA: Insufficient documentation

## 2013-01-10 DIAGNOSIS — K1370 Unspecified lesions of oral mucosa: Secondary | ICD-10-CM | POA: Insufficient documentation

## 2013-01-10 DIAGNOSIS — X58XXXA Exposure to other specified factors, initial encounter: Secondary | ICD-10-CM | POA: Insufficient documentation

## 2013-01-10 DIAGNOSIS — Y9389 Activity, other specified: Secondary | ICD-10-CM | POA: Insufficient documentation

## 2013-01-10 DIAGNOSIS — K029 Dental caries, unspecified: Secondary | ICD-10-CM | POA: Insufficient documentation

## 2013-01-10 DIAGNOSIS — K08109 Complete loss of teeth, unspecified cause, unspecified class: Secondary | ICD-10-CM | POA: Insufficient documentation

## 2013-01-10 DIAGNOSIS — S025XXA Fracture of tooth (traumatic), initial encounter for closed fracture: Secondary | ICD-10-CM | POA: Insufficient documentation

## 2013-01-10 DIAGNOSIS — G8929 Other chronic pain: Secondary | ICD-10-CM | POA: Insufficient documentation

## 2013-01-10 DIAGNOSIS — Z862 Personal history of diseases of the blood and blood-forming organs and certain disorders involving the immune mechanism: Secondary | ICD-10-CM | POA: Insufficient documentation

## 2013-01-10 DIAGNOSIS — Z880 Allergy status to penicillin: Secondary | ICD-10-CM | POA: Insufficient documentation

## 2013-01-10 DIAGNOSIS — M549 Dorsalgia, unspecified: Secondary | ICD-10-CM | POA: Insufficient documentation

## 2013-01-10 DIAGNOSIS — Z87440 Personal history of urinary (tract) infections: Secondary | ICD-10-CM | POA: Insufficient documentation

## 2013-01-10 DIAGNOSIS — Y929 Unspecified place or not applicable: Secondary | ICD-10-CM | POA: Insufficient documentation

## 2013-01-10 DIAGNOSIS — Z8679 Personal history of other diseases of the circulatory system: Secondary | ICD-10-CM | POA: Insufficient documentation

## 2013-01-10 DIAGNOSIS — Z8711 Personal history of peptic ulcer disease: Secondary | ICD-10-CM | POA: Insufficient documentation

## 2013-01-10 DIAGNOSIS — L089 Local infection of the skin and subcutaneous tissue, unspecified: Secondary | ICD-10-CM | POA: Insufficient documentation

## 2013-01-10 DIAGNOSIS — Z8719 Personal history of other diseases of the digestive system: Secondary | ICD-10-CM | POA: Insufficient documentation

## 2013-01-10 DIAGNOSIS — I10 Essential (primary) hypertension: Secondary | ICD-10-CM | POA: Insufficient documentation

## 2013-01-10 MED ORDER — HYDROCODONE-ACETAMINOPHEN 5-325 MG PO TABS: 1.0000 | ORAL_TABLET | ORAL | Status: DC | PRN

## 2013-01-10 MED ORDER — CLINDAMYCIN HCL 150 MG PO CAPS
150.0000 mg | ORAL_CAPSULE | Freq: Four times a day (QID) | ORAL | Status: DC
Start: 2013-01-10 — End: 2013-01-14

## 2013-01-10 MED ORDER — HYDROCODONE-ACETAMINOPHEN 5-325 MG PO TABS
1.0000 | ORAL_TABLET | Freq: Once | ORAL | Status: AC
  Administered 2013-01-10: 1 via ORAL
  Filled 2013-01-10: qty 1

## 2013-01-10 MED ORDER — ACYCLOVIR 5 % EX OINT: TOPICAL_OINTMENT | CUTANEOUS | Status: DC

## 2013-01-10 NOTE — ED Notes (Signed)
Pt reports Thursday notced a bump on the inside of her r cheek and area was painful.  Tuesday, pt had a tooth break off. C/O toothache and pain in r cheek.

## 2013-01-10 NOTE — ED Notes (Signed)
Pt c/o "bump" on inside of right cheek last week, started chewing on left side due to area painful, breaking tooth of left side. Pt dentist unable to see her until June 4th.

## 2013-01-11 NOTE — ED Provider Notes (Signed)
History     CSN: [id number]  Arrival date & time 01/10/13  1524   First MD Initiated Contact with Patient 01/10/13 1641      Chief Complaint  Patient presents with  . Dental Pain    (Consider location/radiation/quality/duration/timing/severity/associated sxs/prior treatment) HPI Comments: Jenna Henderson is a 32 y.o. Female presenting with pain and swelling of her right cheek and lip area which started 5 days ago as a "blister" on her right lip edge which has since popped and drained white thick drainage but has since been wet and draining a small amount of clear fluid.  She denies previous history of similar symptoms.  Prior to this occuring she had pain and tingling at the site.  Two days ago,  She also had a piece of tooth which had a long standing cavity in it break off while eating, now with increased pain at this site as well.  She has increased sensitivity to hot and cold. She has used tylenol without relief of symptoms.  She denies fevers, chills,  Has no sore throat, mouth or throat swelling and denies pain with swallowing.  She has scheduled an appointment with her dentist,  But cannot be seen until next week.     The history is provided by the patient.    Past Medical History  Diagnosis Date  . MS (multiple sclerosis)   . Thyroid disease   . Hypertension   . Peptic ulcer   . Crohn's disease   . Migraine   . Chronic back pain   . Dental caries   . UTI (lower urinary tract infection)     Past Surgical History  Procedure Laterality Date  . Cholecystectomy    . Cesarean section    . Mouth surgery      removal of 8 teeth  . Tubal ligation      No family history on file.  History  Substance Use Topics  . Smoking status: Never Smoker   . Smokeless tobacco: Not on file  . Alcohol Use: No    OB History   Grav Para Term Preterm Abortions TAB SAB Ect Mult Living            3      Review of Systems  Constitutional: Negative for fever and chills.  HENT:  Positive for dental problem. Negative for sore throat, facial swelling, neck pain and neck stiffness.   Respiratory: Negative for shortness of breath.   Skin: Positive for wound.    Allergies  Prednisone; Aspirin; Nsaids; Penicillins; Ultram; Imitrex; Ketorolac tromethamine; and Other  Home Medications   Current Outpatient Rx  Name  Route  Sig  Dispense  Refill  . acetaminophen (TYLENOL) 500 MG tablet   Oral   Take 1,000 mg by mouth every 6 (six) hours as needed for pain.         Marland Kitchen acyclovir ointment (ZOVIRAX) 5 %   Topical   Apply topically every 4 (four) hours.   15 g   0   . ALPRAZolam (XANAX) 1 MG tablet   Oral   Take 1 mg by mouth 3 (three) times daily.         . ciprofloxacin (CIPRO) 500 MG tablet   Oral   Take 500 mg by mouth 2 (two) times daily.         . clindamycin (CLEOCIN) 150 MG capsule   Oral   Take 1 capsule (150 mg total) by mouth every 6 (six) hours.  28 capsule   0   . cyclobenzaprine (FLEXERIL) 10 MG tablet   Oral   Take 1 tablet (10 mg total) by mouth 2 (two) times daily as needed for muscle spasms.   20 tablet   0   . dicyclomine (BENTYL) 10 MG capsule   Oral   Take 10 mg by mouth 3 (three) times daily.          Marland Kitchen HYDROcodone-acetaminophen (NORCO/VICODIN) 5-325 MG per tablet   Oral   Take 1 tablet by mouth every 4 (four) hours as needed for pain.   15 tablet   0   . methocarbamol (ROBAXIN) 500 MG tablet      Two tabs po TID x 7 days   42 tablet   0   . omeprazole (PRILOSEC) 20 MG capsule   Oral   Take 20 mg by mouth daily.         Marland Kitchen oxyCODONE-acetaminophen (PERCOCET/ROXICET) 5-325 MG per tablet   Oral   Take 1 tablet by mouth every 4 (four) hours as needed for pain.   15 tablet   0     BP 111/75  Pulse 89  Temp(Src) 98.2 F (36.8 C) (Oral)  Resp 20  Ht 5\' 5"  (1.651 m)  Wt 263 lb (119.296 kg)  BMI 43.77 kg/m2  SpO2 100%  LMP 01/01/2013  Physical Exam  Constitutional: She is oriented to person, place, and  time. She appears well-developed and well-nourished. No distress.  HENT:  Head: Normocephalic and atraumatic. No trismus in the jaw.  Right Ear: Tympanic membrane and external ear normal.  Left Ear: Tympanic membrane and external ear normal.  Mouth/Throat: Oropharynx is clear and moist and mucous membranes are normal. No oral lesions. Dental caries present. No dental abscesses. No oropharyngeal exudate, posterior oropharyngeal edema or posterior oropharyngeal erythema.  #10 with evidence of old decay and fracture from lateral and posterior edges of tooth.  No surrounding gingival edema or erythema.  She has a 1 cm tender induration at her right lip border with a 0.25 cm central ulceration with slightly erythematous borders,  Moist base,  No purulent discharge.  No fluctuance.    Eyes: Conjunctivae are normal.  Neck: Normal range of motion. Neck supple.  Cardiovascular: Normal rate and normal heart sounds.   Pulmonary/Chest: Effort normal.  Abdominal: She exhibits no distension.  Musculoskeletal: Normal range of motion.  Lymphadenopathy:    She has no cervical adenopathy.  Neurological: She is alert and oriented to person, place, and time.  Skin: Skin is warm and dry. No erythema.  Psychiatric: She has a normal mood and affect.    ED Course  Procedures (including critical care time)  Labs Reviewed  HERPES SIMPLEX VIRUS CULTURE   No results found.   1. Broken tooth, closed, initial encounter   2. Local skin infection       MDM  Dental decay and fracture without obvious infection at the #10 tooth. She does have an infection at her right lip border which is not clearly of dental origin,  Possibly new onset herpetic lesion. Viral culture was obtained.  She was covered for both possibilities with clindamycin and topical zovirax ointment.  Prescribed hydrocodone.  Encouraged f/u with dentist next week as planned.        Burgess Amor, PA-C 01/11/13 1421

## 2013-01-11 NOTE — ED Provider Notes (Signed)
Medical screening examination/treatment/procedure(s) were performed by non-physician practitioner and as supervising physician I was immediately available for consultation/collaboration.  Shanaya Schneck R. Floreine Kingdon, MD 01/11/13 2330 

## 2013-01-12 LAB — HERPES SIMPLEX VIRUS CULTURE

## 2013-01-14 ENCOUNTER — Emergency Department (HOSPITAL_COMMUNITY)
Admission: EM | Admit: 2013-01-14 | Discharge: 2013-01-14 | Disposition: A | Discharge status: Home / Self Care | Payer: Medicaid Other | Attending: Emergency Medicine | Admitting: Emergency Medicine

## 2013-01-14 ENCOUNTER — Encounter (HOSPITAL_COMMUNITY): Payer: Self-pay | Admitting: Emergency Medicine

## 2013-01-14 DIAGNOSIS — Z862 Personal history of diseases of the blood and blood-forming organs and certain disorders involving the immune mechanism: Secondary | ICD-10-CM | POA: Insufficient documentation

## 2013-01-14 DIAGNOSIS — Z8719 Personal history of other diseases of the digestive system: Secondary | ICD-10-CM | POA: Insufficient documentation

## 2013-01-14 DIAGNOSIS — K029 Dental caries, unspecified: Secondary | ICD-10-CM

## 2013-01-14 DIAGNOSIS — G8929 Other chronic pain: Secondary | ICD-10-CM | POA: Insufficient documentation

## 2013-01-14 DIAGNOSIS — G43909 Migraine, unspecified, not intractable, without status migrainosus: Secondary | ICD-10-CM | POA: Insufficient documentation

## 2013-01-14 DIAGNOSIS — Z880 Allergy status to penicillin: Secondary | ICD-10-CM | POA: Insufficient documentation

## 2013-01-14 DIAGNOSIS — K5090 Crohn's disease, unspecified, without complications: Secondary | ICD-10-CM | POA: Insufficient documentation

## 2013-01-14 DIAGNOSIS — Z87440 Personal history of urinary (tract) infections: Secondary | ICD-10-CM | POA: Insufficient documentation

## 2013-01-14 DIAGNOSIS — Z8659 Personal history of other mental and behavioral disorders: Secondary | ICD-10-CM | POA: Insufficient documentation

## 2013-01-14 DIAGNOSIS — I10 Essential (primary) hypertension: Secondary | ICD-10-CM | POA: Insufficient documentation

## 2013-01-14 DIAGNOSIS — Z79899 Other long term (current) drug therapy: Secondary | ICD-10-CM | POA: Insufficient documentation

## 2013-01-14 DIAGNOSIS — Z8711 Personal history of peptic ulcer disease: Secondary | ICD-10-CM | POA: Insufficient documentation

## 2013-01-14 DIAGNOSIS — Z8639 Personal history of other endocrine, nutritional and metabolic disease: Secondary | ICD-10-CM | POA: Insufficient documentation

## 2013-01-14 MED ORDER — HYDROCODONE-ACETAMINOPHEN 5-325 MG PO TABS: 2.0000 | ORAL_TABLET | ORAL | Status: DC | PRN

## 2013-01-14 MED ORDER — METRONIDAZOLE 500 MG PO TABS: 500.0000 mg | ORAL_TABLET | Freq: Two times a day (BID) | ORAL | Status: DC

## 2013-01-14 MED ORDER — HYDROCODONE-ACETAMINOPHEN 5-325 MG PO TABS: 1.0000 | ORAL_TABLET | ORAL | Status: DC | PRN

## 2013-01-14 NOTE — ED Notes (Signed)
Pt c/o right side dental pain x1 week. Pt was seen previously for same symptoms and was given meds for home. Pt reports symptoms have worsened. Pt has appointment on Thursday to have tooth removed but states symptoms are too bothersome to wait.

## 2013-01-14 NOTE — ED Provider Notes (Signed)
History     CSN: [id number]  Arrival date & time 01/14/13  2002   First MD Initiated Contact with Patient 01/14/13 2019      Chief Complaint  Patient presents with  . Dental Pain    (Consider location/radiation/quality/duration/timing/severity/associated sxs/prior treatment) HPI Jenna Henderson is a 32 y.o. female who presents to the ED for dental pain. She was here last week and started on Clindamycin and it gave her diarrhea. She continued the medication but still has pain and swelling in the gum area. She is scheduled to [facility]. The history was provided by the patient.  Past Medical History  Diagnosis Date  . MS (multiple sclerosis)   . Thyroid disease   . Hypertension   . Peptic ulcer   . Crohn's disease   . Migraine   . Chronic back pain   . Dental caries   . UTI (lower urinary tract infection)     Past Surgical History  Procedure Laterality Date  . Cholecystectomy    . Cesarean section    . Mouth surgery      removal of 8 teeth  . Tubal ligation      History reviewed. No pertinent family history.  History  Substance Use Topics  . Smoking status: Never Smoker   . Smokeless tobacco: Not on file  . Alcohol Use: No    OB History   Grav Para Term Preterm Abortions TAB SAB Ect Mult Living            3      Review of Systems  Constitutional: Negative for fever and chills.  HENT: Positive for dental problem. Negative for neck pain.   Respiratory: Negative for cough.   Musculoskeletal: Negative for back pain.  Skin: Negative for rash.  Psychiatric/Behavioral: The patient is not nervous/anxious.     Allergies  Prednisone; Aspirin; Nsaids; Penicillins; Ultram; Imitrex; Ketorolac tromethamine; and Other  Home Medications   Current Outpatient Rx  Name  Route  Sig  Dispense  Refill  . acetaminophen (TYLENOL) 500 MG tablet   Oral   Take 1,000 mg by mouth every 6 (six) hours as needed for pain.          Marland Kitchen acyclovir ointment (ZOVIRAX) 5 %   Topical   Apply topically every 4 (four) hours.   15 g   0   . ALPRAZolam (XANAX) 1 MG tablet   Oral   Take 1 mg by mouth 3 (three) times daily.         . ciprofloxacin (CIPRO) 500 MG tablet   Oral   Take 500 mg by mouth 2 (two) times daily.         . clindamycin (CLEOCIN) 150 MG capsule   Oral   Take 1 capsule (150 mg total) by mouth every 6 (six) hours.   28 capsule   0   . cyclobenzaprine (FLEXERIL) 10 MG tablet   Oral   Take 1 tablet (10 mg total) by mouth 2 (two) times daily as needed for muscle spasms.   20 tablet   0   . dicyclomine (BENTYL) 10 MG capsule   Oral   Take 10 mg by mouth 3 (three) times daily.          Marland Kitchen HYDROcodone-acetaminophen (NORCO/VICODIN) 5-325 MG per tablet   Oral   Take 1 tablet by mouth every 4 (four) hours as needed for pain.   15 tablet  0   . methocarbamol (ROBAXIN) 500 MG tablet      Two tabs po TID x 7 days   42 tablet   0   . omeprazole (PRILOSEC) 20 MG capsule   Oral   Take 20 mg by mouth daily.         Marland Kitchen oxyCODONE-acetaminophen (PERCOCET/ROXICET) 5-325 MG per tablet   Oral   Take 1 tablet by mouth every 4 (four) hours as needed for pain.   15 tablet   0     BP 138/82  Pulse 85  Temp(Src) 98.5 F (36.9 C)  Resp 18  SpO2 100%  LMP 01/01/2013  Physical Exam  Nursing note and vitals reviewed. Constitutional: She is oriented to person, place, and time. No distress.  Obese A/A female   HENT:  Head: Normocephalic.  Mouth/Throat: Uvula is midline and oropharynx is clear and moist. Dental caries present.  Dental caries upper and lower teeth. Tender left upper. Swelling and erythema of gums surrounding the upper teeth on the left.  Eyes: EOM are normal.  Neck: Neck supple.  Pulmonary/Chest: Effort normal.  Abdominal: Soft. There is no tenderness.  Musculoskeletal: Normal range of motion.  Neurological: She is alert and oriented to person, place, and time. No cranial  nerve deficit.  Skin: Skin is warm and dry.  Psychiatric: She has a normal mood and affect.    ED Course  Procedures (including critical care time)  MDM  32 y.o. female with dental pain due to caries. She will [facility] her [facility] for [facility]. Will continue antibiotics and pain management.  Discussed with the patient plan of care and all questioned fully answered. She will return if any problems arise.    Medication List    STOP taking these medications       acetaminophen 500 MG tablet  Commonly known as:  TYLENOL     acyclovir ointment 5 %  Commonly known as:  ZOVIRAX     ciprofloxacin 500 MG tablet  Commonly known as:  CIPRO     clindamycin 150 MG capsule  Commonly known as:  CLEOCIN     cyclobenzaprine 10 MG tablet  Commonly known as:  FLEXERIL     methocarbamol 500 MG tablet  Commonly known as:  ROBAXIN     oxyCODONE-acetaminophen 5-325 MG per tablet  Commonly known as:  PERCOCET/ROXICET      TAKE these medications       HYDROcodone-acetaminophen 5-325 MG per tablet  Commonly known as:  NORCO/VICODIN  Take 1 tablet by mouth every 4 (four) hours as needed.     metroNIDAZOLE 500 MG tablet  Commonly known as:  FLAGYL  Take 1 tablet (500 mg total) by mouth 2 (two) times daily.      ASK your doctor about these medications       ALPRAZolam 1 MG tablet  Commonly known as:  XANAX  Take 1 mg by mouth 3 (three) times daily.     dicyclomine 10 MG capsule  Commonly known as:  BENTYL  Take 10 mg by mouth 3 (three) times daily.     omeprazole 20 MG capsule  Commonly known as:  PRILOSEC  Take 20 mg by mouth daily.               [place], [place] 01/14/13 2104

## 2013-01-14 NOTE — ED Provider Notes (Signed)
Medical screening examination/treatment/procedure(s) were performed by non-physician practitioner and as supervising physician I was immediately available for consultation/collaboration.   Dione Booze, MD 01/14/13 [phone]

## 2013-01-16 MED FILL — Hydrocodone-Acetaminophen Tab 5-325 MG: ORAL | Qty: 6 | Status: AC

## 2013-01-26 ENCOUNTER — Encounter (HOSPITAL_COMMUNITY): Payer: Self-pay | Admitting: *Deleted

## 2013-01-26 ENCOUNTER — Emergency Department (HOSPITAL_COMMUNITY)
Admission: EM | Admit: 2013-01-26 | Discharge: 2013-01-26 | Disposition: A | Discharge status: Home / Self Care | Payer: Medicaid Other | Attending: Emergency Medicine | Admitting: Emergency Medicine

## 2013-01-26 DIAGNOSIS — Z8711 Personal history of peptic ulcer disease: Secondary | ICD-10-CM | POA: Insufficient documentation

## 2013-01-26 DIAGNOSIS — Y939 Activity, unspecified: Secondary | ICD-10-CM | POA: Insufficient documentation

## 2013-01-26 DIAGNOSIS — Z79899 Other long term (current) drug therapy: Secondary | ICD-10-CM | POA: Insufficient documentation

## 2013-01-26 DIAGNOSIS — Z8669 Personal history of other diseases of the nervous system and sense organs: Secondary | ICD-10-CM | POA: Insufficient documentation

## 2013-01-26 DIAGNOSIS — Y929 Unspecified place or not applicable: Secondary | ICD-10-CM | POA: Insufficient documentation

## 2013-01-26 DIAGNOSIS — Z8639 Personal history of other endocrine, nutritional and metabolic disease: Secondary | ICD-10-CM | POA: Insufficient documentation

## 2013-01-26 DIAGNOSIS — Z8719 Personal history of other diseases of the digestive system: Secondary | ICD-10-CM | POA: Insufficient documentation

## 2013-01-26 DIAGNOSIS — M549 Dorsalgia, unspecified: Secondary | ICD-10-CM

## 2013-01-26 DIAGNOSIS — IMO0002 Reserved for concepts with insufficient information to code with codable children: Secondary | ICD-10-CM | POA: Insufficient documentation

## 2013-01-26 DIAGNOSIS — Z8679 Personal history of other diseases of the circulatory system: Secondary | ICD-10-CM | POA: Insufficient documentation

## 2013-01-26 DIAGNOSIS — Z87440 Personal history of urinary (tract) infections: Secondary | ICD-10-CM | POA: Insufficient documentation

## 2013-01-26 DIAGNOSIS — Z3202 Encounter for pregnancy test, result negative: Secondary | ICD-10-CM | POA: Insufficient documentation

## 2013-01-26 DIAGNOSIS — Z862 Personal history of diseases of the blood and blood-forming organs and certain disorders involving the immune mechanism: Secondary | ICD-10-CM | POA: Insufficient documentation

## 2013-01-26 DIAGNOSIS — X500XXA Overexertion from strenuous movement or load, initial encounter: Secondary | ICD-10-CM | POA: Insufficient documentation

## 2013-01-26 DIAGNOSIS — I10 Essential (primary) hypertension: Secondary | ICD-10-CM | POA: Insufficient documentation

## 2013-01-26 DIAGNOSIS — Z880 Allergy status to penicillin: Secondary | ICD-10-CM | POA: Insufficient documentation

## 2013-01-26 LAB — URINALYSIS, ROUTINE W REFLEX MICROSCOPIC
Glucose, UA: NEGATIVE mg/dL
Hgb urine dipstick: NEGATIVE
Specific Gravity, Urine: <1.005 — ABNORMAL LOW (ref 1.005–1.030)
pH: 6.5 (ref 5.0–8.0)

## 2013-01-26 LAB — POCT PREGNANCY, URINE: Preg Test, Ur: NEGATIVE

## 2013-01-26 MED ORDER — HYDROCODONE-ACETAMINOPHEN 5-325 MG PO TABS: ORAL_TABLET | ORAL | Status: DC

## 2013-01-26 MED ORDER — CYCLOBENZAPRINE HCL 10 MG PO TABS: 10.0000 mg | ORAL_TABLET | Freq: Three times a day (TID) | ORAL | Status: DC | PRN

## 2013-01-26 MED ORDER — HYDROMORPHONE HCL PF 1 MG/ML IJ SOLN
1.0000 mg | Freq: Once | INTRAMUSCULAR | Status: AC
  Administered 2013-01-26: 1 mg via INTRAMUSCULAR
  Filled 2013-01-26: qty 1

## 2013-01-26 NOTE — ED Notes (Signed)
nad noted prior to dc. Dc instructions reviewed with explained with pt. 2 scripts given. Ambulated out without difficulty.

## 2013-01-26 NOTE — ED Notes (Signed)
Pt up ambulating without difficulty to bathroom

## 2013-01-26 NOTE — ED Notes (Signed)
C/o lower back pain since moving boxes sunday

## 2013-01-27 NOTE — ED Provider Notes (Signed)
History     CSN: [id number]  Arrival date & time 01/26/13  1049   First MD Initiated Contact with Patient 01/26/13 1109      Chief Complaint  Patient presents with  . Back Pain    (Consider location/radiation/quality/duration/timing/severity/associated sxs/prior treatment) Patient is a 32 y.o. female presenting with back pain. The history is provided by the patient.  Back Pain Location:  Lumbar spine Quality:  Aching Radiates to:  Does not radiate Pain severity:  Moderate Pain is:  Same all the time Onset quality:  Gradual Duration:  4 days Timing:  Constant Progression:  Unchanged Chronicity:  Recurrent Context: lifting heavy objects   Relieved by:  Lying down Worsened by:  Ambulation, bending, standing, sitting and twisting Associated symptoms: no abdominal pain, no abdominal swelling, no bladder incontinence, no bowel incontinence, no chest pain, no dysuria, no fever, no headaches, no numbness, no paresthesias, no pelvic pain, no perianal numbness, no tingling and no weakness     Past Medical History  Diagnosis Date  . MS (multiple sclerosis)   . Thyroid disease   . Hypertension   . Peptic ulcer   . Crohn's disease   . Migraine   . Chronic back pain   . Dental caries   . UTI (lower urinary tract infection)     Past Surgical History  Procedure Laterality Date  . Cholecystectomy    . Cesarean section    . Mouth surgery      removal of 8 teeth  . Tubal ligation      History reviewed. No pertinent family history.  History  Substance Use Topics  . Smoking status: Never Smoker   . Smokeless tobacco: Not on file  . Alcohol Use: No    OB History   Grav Para Term Preterm Abortions TAB SAB Ect Mult Living            3      Review of Systems  Constitutional: Negative for fever.  Respiratory: Negative for shortness of breath.   Cardiovascular: Negative for chest pain.  Gastrointestinal: Negative for vomiting, abdominal pain, constipation and bowel  incontinence.  Genitourinary: Negative for bladder incontinence, dysuria, hematuria, flank pain, decreased urine volume, difficulty urinating and pelvic pain.       No perineal numbness or incontinence of urine or feces  Musculoskeletal: Positive for back pain. Negative for joint swelling.  Skin: Negative for rash.  Neurological: Negative for tingling, weakness, numbness, headaches and paresthesias.  All other systems reviewed and are negative.    Allergies  Prednisone; Aspirin; Nsaids; Penicillins; Ultram; Imitrex; Ketorolac tromethamine; and Other  Home Medications   Current Outpatient Rx  Name  Route  Sig  Dispense  Refill  . acetaminophen (TYLENOL) 500 MG tablet   Oral   Take 1,000 mg by mouth every 6 (six) hours as needed for pain.         Marland Kitchen ALPRAZolam (XANAX) 1 MG tablet   Oral   Take 1 mg by mouth 3 (three) times daily.         Marland Kitchen dicyclomine (BENTYL) 10 MG capsule   Oral   Take 10 mg by mouth 3 (three) times daily.          . metroNIDAZOLE (FLAGYL) 500 MG tablet   Oral   Take 1 tablet (500 mg total) by mouth 2 (two) times daily.   14 tablet   0   . omeprazole (PRILOSEC) 20 MG capsule   Oral   Take 20  mg by mouth daily.         . cyclobenzaprine (FLEXERIL) 10 MG tablet   Oral   Take 1 tablet (10 mg total) by mouth 3 (three) times daily as needed for muscle spasms.   21 tablet   0   . HYDROcodone-acetaminophen (NORCO/VICODIN) 5-325 MG per tablet   Oral   Take 1 tablet by mouth every 4 (four) hours as needed.   15 tablet   0   . HYDROcodone-acetaminophen (NORCO/VICODIN) 5-325 MG per tablet      Take one-two tabs po q 4-6 hrs prn pain   8 tablet   0     BP 128/79  Pulse 85  Temp(Src) 98 F (36.7 C) (Oral)  Resp 18  SpO2 100%  LMP 12/29/2012  Physical Exam  Nursing note and vitals reviewed. Constitutional: She is oriented to person, place, and time. She appears well-developed and well-nourished. No distress.  HENT:  Head: Normocephalic  and atraumatic.  Neck: Normal range of motion. Neck supple.  Cardiovascular: Normal rate, regular rhythm, normal heart sounds and intact distal pulses.   No murmur heard. Pulmonary/Chest: Effort normal and breath sounds normal. No respiratory distress.  Musculoskeletal: She exhibits tenderness. She exhibits no edema.       Lumbar back: She exhibits tenderness and pain. She exhibits normal range of motion, no swelling, no deformity, no laceration and normal pulse.  ttp of the lumbar spine and paraspinal muscles.    DP pulses are brisk and symmetrical.  Distal sensation intact.  Hip Flexors/Extensors are intact  Neurological: She is alert and oriented to person, place, and time. No cranial nerve deficit or sensory deficit. She exhibits normal muscle tone. Coordination and gait normal.  Reflex Scores:      Patellar reflexes are 2+ on the right side and 2+ on the left side.      Achilles reflexes are 2+ on the right side and 2+ on the left side. Skin: Skin is warm and dry.    ED Course  Procedures (including critical care time)  Labs Reviewed  URINALYSIS, ROUTINE W REFLEX MICROSCOPIC - Abnormal; Notable for the following:    Specific Gravity, Urine <1.005 (*)    All other components within normal limits  POCT PREGNANCY, URINE     1. Back pain       MDM    Previous ED chart reviewed.    Patient has hx of chronic low back pain .  Reports that she missed her appt with pain management last week and has rescheduled.  I doubt emergent neurological or infectious process.  Has ttp of the lower lumbar spina dn paraspinal muscle.  No motor or neuro deficits on exam.  Ambulates with steady gait.     I have advised patient that she will need further management with a PMD or pain clinic , that ED is not proper location to manage chronic pain.  Patient verbalized understanding and agreed to care plan.            Nichols Corter L. Trisha Mangle, PA-C 01/27/13 2254

## 2013-01-28 NOTE — ED Provider Notes (Signed)
Medical screening examination/treatment/procedure(s) were performed by non-physician practitioner and as supervising physician I was immediately available for consultation/collaboration.   Ligia Duguay, MD 01/28/13 0638 

## 2013-02-06 ENCOUNTER — Emergency Department (HOSPITAL_COMMUNITY)
Admission: EM | Admit: 2013-02-06 | Discharge: 2013-02-06 | Disposition: A | Discharge status: Home / Self Care | Payer: Medicaid Other | Attending: Emergency Medicine | Admitting: Emergency Medicine

## 2013-02-06 ENCOUNTER — Encounter (HOSPITAL_COMMUNITY): Payer: Self-pay | Admitting: Nurse Practitioner

## 2013-02-06 DIAGNOSIS — K0889 Other specified disorders of teeth and supporting structures: Secondary | ICD-10-CM

## 2013-02-06 DIAGNOSIS — Z79899 Other long term (current) drug therapy: Secondary | ICD-10-CM | POA: Insufficient documentation

## 2013-02-06 DIAGNOSIS — G35 Multiple sclerosis: Secondary | ICD-10-CM | POA: Insufficient documentation

## 2013-02-06 DIAGNOSIS — K089 Disorder of teeth and supporting structures, unspecified: Secondary | ICD-10-CM | POA: Insufficient documentation

## 2013-02-06 DIAGNOSIS — Z880 Allergy status to penicillin: Secondary | ICD-10-CM | POA: Insufficient documentation

## 2013-02-06 DIAGNOSIS — M545 Low back pain, unspecified: Secondary | ICD-10-CM | POA: Insufficient documentation

## 2013-02-06 DIAGNOSIS — E079 Disorder of thyroid, unspecified: Secondary | ICD-10-CM | POA: Insufficient documentation

## 2013-02-06 DIAGNOSIS — G8929 Other chronic pain: Secondary | ICD-10-CM

## 2013-02-06 DIAGNOSIS — Z888 Allergy status to other drugs, medicaments and biological substances status: Secondary | ICD-10-CM | POA: Insufficient documentation

## 2013-02-06 DIAGNOSIS — Z8679 Personal history of other diseases of the circulatory system: Secondary | ICD-10-CM | POA: Insufficient documentation

## 2013-02-06 DIAGNOSIS — I10 Essential (primary) hypertension: Secondary | ICD-10-CM | POA: Insufficient documentation

## 2013-02-06 DIAGNOSIS — Z8719 Personal history of other diseases of the digestive system: Secondary | ICD-10-CM | POA: Insufficient documentation

## 2013-02-06 MED ORDER — ONDANSETRON 4 MG PO TBDP
4.0000 mg | ORAL_TABLET | ORAL | Status: AC
  Administered 2013-02-06: 4 mg via ORAL
  Filled 2013-02-06: qty 1

## 2013-02-06 MED ORDER — ACETAMINOPHEN 500 MG PO TABS
1000.0000 mg | ORAL_TABLET | Freq: Once | ORAL | Status: DC
  Filled 2013-02-06 (×2): qty 2

## 2013-02-06 MED ORDER — CYCLOBENZAPRINE HCL 10 MG PO TABS
10.0000 mg | ORAL_TABLET | Freq: Once | ORAL | Status: AC
  Administered 2013-02-06: 10 mg via ORAL
  Filled 2013-02-06: qty 1

## 2013-02-06 MED ORDER — METRONIDAZOLE 500 MG PO TABS: 500.0000 mg | ORAL_TABLET | Freq: Two times a day (BID) | ORAL | Status: DC

## 2013-02-06 MED ORDER — CYCLOBENZAPRINE HCL 10 MG PO TABS: 10.0000 mg | ORAL_TABLET | Freq: Two times a day (BID) | ORAL | Status: DC | PRN

## 2013-02-06 MED ORDER — HYDROMORPHONE HCL PF 1 MG/ML IJ SOLN
1.0000 mg | Freq: Once | INTRAMUSCULAR | Status: AC
  Administered 2013-02-06: 1 mg via INTRAMUSCULAR
  Filled 2013-02-06: qty 1
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
## 2013-02-06 NOTE — ED Notes (Signed)
Pt states she had taken Tylenol just PTA.

## 2013-02-06 NOTE — ED Provider Notes (Signed)
History    CSN: [id number] Arrival date & time 02/06/13  1217  First MD Initiated Contact with Patient 02/06/13 1353     Chief Complaint  Patient presents with  . Back Pain   (Consider location/radiation/quality/duration/timing/severity/associated sxs/prior Treatment) HPI Comments: Patient is a 32 year old female with a past medical history of MS who presents with gradual onset of lower back pain that 2 days ago. The pain is aching and severe and radiates down her bilateral legs. The pain is constant. Movement makes the pain worse. Nothing makes the pain better. Patient has not tried anything for pain. No associated symptoms. No saddles paresthesias or bladder/bowel incontinence.   Patient also complains of front tooth pain that started 2 days ago when she was eating a pork rind and chipped her tooth. The dental pain is severe, constant and progressively worsening. The pain is aching and located in left front tooth. The pain does not radiate. Eating makes the pain worse. Nothing makes the pain better. The patient has not tried anything for pain. No associated symptoms. Patient denies headache, neck pain/stiffness, fever, NVD, edema, sore throat, throat swelling, wheezing, SOB, chest pain, abdominal pain.       Patient is a 32 y.o. female presenting with back pain.  Back Pain  Past Medical History  Diagnosis Date  . MS (multiple sclerosis)   . Thyroid disease   . Hypertension   . Peptic ulcer   . Crohn's disease   . Migraine   . Chronic back pain   . Dental caries   . UTI (lower urinary tract infection)   . Chronic back pain    Past Surgical History  Procedure Laterality Date  . Cholecystectomy    . Cesarean section    . Mouth surgery      removal of 8 teeth  . Tubal ligation     History reviewed. No pertinent family history. History  Substance Use Topics  . Smoking status: Never Smoker   . Smokeless tobacco: Not on file  . Alcohol Use: No   OB History   Grav Para  Term Preterm Abortions TAB SAB Ect Mult Living            3     Review of Systems  HENT: Positive for dental problem.   Musculoskeletal: Positive for back pain.  All other systems reviewed and are negative.    Allergies  Prednisone; Aspirin; Nsaids; Penicillins; Ultram; Imitrex; Ketorolac tromethamine; and Other  Home Medications   Current Outpatient Rx  Name  Route  Sig  Dispense  Refill  . acetaminophen (TYLENOL) 500 MG tablet   Oral   Take 1,000 mg by mouth every 6 (six) hours as needed for pain.         Marland Kitchen ALPRAZolam (XANAX) 1 MG tablet   Oral   Take 1 mg by mouth 3 (three) times daily.         Marland Kitchen dicyclomine (BENTYL) 10 MG capsule   Oral   Take 10 mg by mouth 3 (three) times daily.          . Interferon Beta-1b (BETASERON [place])   Subcutaneous   Inject 1 application into the skin every other day.         Marland Kitchen omeprazole (PRILOSEC) 20 MG capsule   Oral   Take 20 mg by mouth daily.          BP 124/91  Pulse 97  Temp(Src) 98.1 F (36.7 C)  Resp 15  Ht 5\' 5"  (1.651 m)  Wt 260 lb (117.935 kg)  BMI 43.27 kg/m2  SpO2 99%  LMP 12/29/2012 Physical Exam  Nursing note and vitals reviewed. Constitutional: She appears well-developed and well-nourished. No distress.  HENT:  Head: Normocephalic and atraumatic.  Poor dentition. Front left tooth cracked and tender to percussion.   Eyes: Conjunctivae are normal.  Neck: Normal range of motion.  Cardiovascular: Normal rate and regular rhythm.  Exam reveals no gallop and no friction rub.   No murmur heard. Pulmonary/Chest: Effort normal and breath sounds normal. She has no wheezes. She has no rales. She exhibits no tenderness.  Abdominal: Soft. There is no tenderness.  Musculoskeletal: Normal range of motion.  No midline spine tenderness to palpation. No paraspinal tenderness to palpation.   Neurological: She is alert.  Speech is goal-oriented. Moves limbs without ataxia.   Skin: Skin is warm and dry.  Psychiatric:  She has a normal mood and affect. Her behavior is normal.    ED Course  Procedures (including critical care time)  Labs Reviewed - No data to display No results found.   1. Chronic back pain   2. Pain, dental     MDM  3:31 PM Patient will have IM dilaudid and flexeril here for pain. Patient will have Flagyl for possible dental infection and flexeril for pain. Patient instructed to follow up with dentist and follow up with a doctor from the resource guide.   Emilia Beck, PA-C 02/06/13 1541

## 2013-02-07 NOTE — ED Provider Notes (Signed)
Medical screening examination/treatment/procedure(s) were performed by non-physician practitioner and as supervising physician I was immediately available for consultation/collaboration.   Sharalyn Lomba Y. Zan Triska, MD 02/07/13 1046 

## 2013-02-12 ENCOUNTER — Emergency Department (HOSPITAL_COMMUNITY)
Admission: EM | Admit: 2013-02-12 | Discharge: 2013-02-13 | Disposition: A | Discharge status: Home / Self Care | Payer: Medicaid Other | Attending: Emergency Medicine | Admitting: Emergency Medicine

## 2013-02-12 DIAGNOSIS — Z8679 Personal history of other diseases of the circulatory system: Secondary | ICD-10-CM | POA: Insufficient documentation

## 2013-02-12 DIAGNOSIS — G8929 Other chronic pain: Secondary | ICD-10-CM

## 2013-02-12 DIAGNOSIS — M545 Low back pain, unspecified: Secondary | ICD-10-CM | POA: Insufficient documentation

## 2013-02-12 DIAGNOSIS — Z79899 Other long term (current) drug therapy: Secondary | ICD-10-CM | POA: Insufficient documentation

## 2013-02-12 DIAGNOSIS — Z888 Allergy status to other drugs, medicaments and biological substances status: Secondary | ICD-10-CM | POA: Insufficient documentation

## 2013-02-12 DIAGNOSIS — M546 Pain in thoracic spine: Secondary | ICD-10-CM | POA: Insufficient documentation

## 2013-02-12 DIAGNOSIS — G35 Multiple sclerosis: Secondary | ICD-10-CM | POA: Insufficient documentation

## 2013-02-12 DIAGNOSIS — Z8719 Personal history of other diseases of the digestive system: Secondary | ICD-10-CM | POA: Insufficient documentation

## 2013-02-12 DIAGNOSIS — Z880 Allergy status to penicillin: Secondary | ICD-10-CM | POA: Insufficient documentation

## 2013-02-12 DIAGNOSIS — E079 Disorder of thyroid, unspecified: Secondary | ICD-10-CM | POA: Insufficient documentation

## 2013-02-12 DIAGNOSIS — I10 Essential (primary) hypertension: Secondary | ICD-10-CM | POA: Insufficient documentation

## 2013-02-12 DIAGNOSIS — R209 Unspecified disturbances of skin sensation: Secondary | ICD-10-CM | POA: Insufficient documentation

## 2013-02-12 DIAGNOSIS — M79609 Pain in unspecified limb: Secondary | ICD-10-CM | POA: Insufficient documentation

## 2013-02-12 DIAGNOSIS — M6281 Muscle weakness (generalized): Secondary | ICD-10-CM | POA: Insufficient documentation
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
## 2013-02-13 MED ORDER — HYDROMORPHONE HCL PF 2 MG/ML IJ SOLN
2.0000 mg | Freq: Once | INTRAMUSCULAR | Status: DC
  Filled 2013-02-13: qty 1

## 2013-02-13 NOTE — ED Provider Notes (Signed)
Medical screening examination/treatment/procedure(s) were performed by non-physician practitioner and as supervising physician I was immediately available for consultation/collaboration.   Ammie Warrick M Kenneth Lax, MD 02/13/13 0500 
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
## 2013-02-13 NOTE — ED Provider Notes (Signed)
History    CSN: [id number] Arrival date & time 02/12/13  2345  First MD Initiated Contact with Patient 02/13/13 0007     Chief Complaint  Patient presents with  . Back Pain   (Consider location/radiation/quality/duration/timing/severity/associated sxs/prior Treatment) HPI Comments: Patient presents with exerbation of her chronic low back pain stating she was lifting her uncle who had a stroke and felt a pull in her mid/lower back that has gotten worse since this afternoon.   States she had a catch a bus to get here--arrived at 12:30 AM --buses stopped running at 6:30PM, than she stated that her mother brought her to the ED, but left, but she could call her husband to come get her, than walked out into the waiting room and asked another patient if he could drive her home, after he presenedt  to the room he left the ED with her car keys.  She also states that Dr. Nilsa Nutting does not prescribe pain medications to her although the Control Substance DATA base shows that she has been prescribed 90 tablets of 10 mg Percocet on 12/20/12 and 01/19/13 plus additional medication prescribed on 12/06/12, 20 tablets Vicodin 12/09/12 15 tablets percocet, 01/10/13, 15 tablets of Vicodin.   Patient is a 32 y.o. female presenting with back pain. The history is provided by the patient.  Back Pain Location:  Thoracic spine and lumbar spine Quality:  Aching Radiates to:  R posterior upper leg Pain severity:  Severe Onset quality:  Gradual Timing:  Constant Progression:  Worsening Chronicity:  Chronic Context: lifting heavy objects   Relieved by:  Nothing Worsened by:  Movement Ineffective treatments:  Muscle relaxants Associated symptoms: leg pain, numbness and weakness   Associated symptoms: no bladder incontinence, no bowel incontinence, no chest pain, no dysuria and no fever   Risk factors: obesity    Past Medical History  Diagnosis Date  . MS (multiple sclerosis)   . Thyroid disease   . Hypertension    . Peptic ulcer   . Crohn's disease   . Migraine   . Chronic back pain   . Dental caries   . UTI (lower urinary tract infection)   . Chronic back pain    Past Surgical History  Procedure Laterality Date  . Cholecystectomy    . Cesarean section    . Mouth surgery      removal of 8 teeth  . Tubal ligation     No family history on file. History  Substance Use Topics  . Smoking status: Never Smoker   . Smokeless tobacco: Not on file  . Alcohol Use: No   OB History   Grav Para Term Preterm Abortions TAB SAB Ect Mult Living            3     Review of Systems  Constitutional: Negative for fever and chills.  Respiratory: Negative for shortness of breath.   Cardiovascular: Negative for chest pain and leg swelling.  Gastrointestinal: Negative for bowel incontinence.  Genitourinary: Negative for bladder incontinence and dysuria.  Musculoskeletal: Positive for back pain.  Neurological: Positive for weakness and numbness.  All other systems reviewed and are negative.    Allergies  Prednisone; Aspirin; Nsaids; Penicillins; Ultram; Imitrex; Ketorolac tromethamine; and Other  Home Medications   Current Outpatient Rx  Name  Route  Sig  Dispense  Refill  . acetaminophen (TYLENOL) 500 MG tablet   Oral   Take 1,000 mg by mouth every 6 (six) hours as needed for pain.         Marland Kitchen  ALPRAZolam (XANAX) 1 MG tablet   Oral   Take 1 mg by mouth 3 (three) times daily.         . cyclobenzaprine (FLEXERIL) 10 MG tablet   Oral   Take 1 tablet (10 mg total) by mouth 2 (two) times daily as needed for muscle spasms.   20 tablet   0   . dicyclomine (BENTYL) 10 MG capsule   Oral   Take 10 mg by mouth 3 (three) times daily.          . Interferon Beta-1b (BETASERON )   Subcutaneous   Inject 1 application into the skin every other day.         . metroNIDAZOLE (FLAGYL) 500 MG tablet   Oral   Take 1 tablet (500 mg total) by mouth 2 (two) times daily.   14 tablet   0   .  omeprazole (PRILOSEC) 20 MG capsule   Oral   Take 20 mg by mouth daily.          BP 129/82  Pulse 88  Temp(Src) 98.4 F (36.9 C)  Resp 24  SpO2 100%  LMP 02/08/2013 Physical Exam  Nursing note and vitals reviewed. Constitutional: She is oriented to person, place, and time. She appears well-developed and well-nourished.  Morbidly obese  HENT:  Head: Normocephalic and atraumatic.  Eyes: Pupils are equal, round, and reactive to light.  Neck: Normal range of motion.  Cardiovascular: Normal rate and regular rhythm.   Pulmonary/Chest: Effort normal.  Musculoskeletal: Normal range of motion.  Neurological: She is alert and oriented to person, place, and time.  Skin: Skin is warm and dry.    ED Course  Procedures (including critical care time) Labs Reviewed - No data to display No results found. No diagnosis found.  MDM  Patient will not be given additional pain control in the ED due to multiple lies and pain seeking behavior   Arman Filter, NP 02/13/13 0121

## 2013-02-17 ENCOUNTER — Emergency Department (HOSPITAL_COMMUNITY)
Admission: EM | Admit: 2013-02-17 | Discharge: 2013-02-17 | Disposition: A | Discharge status: Home / Self Care | Payer: Medicaid Other | Attending: Emergency Medicine | Admitting: Emergency Medicine

## 2013-02-17 ENCOUNTER — Encounter (HOSPITAL_COMMUNITY): Payer: Self-pay

## 2013-02-17 DIAGNOSIS — Z8719 Personal history of other diseases of the digestive system: Secondary | ICD-10-CM | POA: Insufficient documentation

## 2013-02-17 DIAGNOSIS — K029 Dental caries, unspecified: Secondary | ICD-10-CM | POA: Insufficient documentation

## 2013-02-17 DIAGNOSIS — I10 Essential (primary) hypertension: Secondary | ICD-10-CM | POA: Insufficient documentation

## 2013-02-17 DIAGNOSIS — Y929 Unspecified place or not applicable: Secondary | ICD-10-CM | POA: Insufficient documentation

## 2013-02-17 DIAGNOSIS — Z8639 Personal history of other endocrine, nutritional and metabolic disease: Secondary | ICD-10-CM | POA: Insufficient documentation

## 2013-02-17 DIAGNOSIS — G8929 Other chronic pain: Secondary | ICD-10-CM | POA: Insufficient documentation

## 2013-02-17 DIAGNOSIS — Z79899 Other long term (current) drug therapy: Secondary | ICD-10-CM | POA: Insufficient documentation

## 2013-02-17 DIAGNOSIS — Y9389 Activity, other specified: Secondary | ICD-10-CM | POA: Insufficient documentation

## 2013-02-17 DIAGNOSIS — Z8679 Personal history of other diseases of the circulatory system: Secondary | ICD-10-CM | POA: Insufficient documentation

## 2013-02-17 DIAGNOSIS — S025XXA Fracture of tooth (traumatic), initial encounter for closed fracture: Secondary | ICD-10-CM | POA: Insufficient documentation

## 2013-02-17 DIAGNOSIS — Z862 Personal history of diseases of the blood and blood-forming organs and certain disorders involving the immune mechanism: Secondary | ICD-10-CM | POA: Insufficient documentation

## 2013-02-17 DIAGNOSIS — Z880 Allergy status to penicillin: Secondary | ICD-10-CM | POA: Insufficient documentation

## 2013-02-17 DIAGNOSIS — Z87440 Personal history of urinary (tract) infections: Secondary | ICD-10-CM | POA: Insufficient documentation

## 2013-02-17 DIAGNOSIS — E669 Obesity, unspecified: Secondary | ICD-10-CM | POA: Insufficient documentation

## 2013-02-17 DIAGNOSIS — Z8669 Personal history of other diseases of the nervous system and sense organs: Secondary | ICD-10-CM | POA: Insufficient documentation

## 2013-02-17 DIAGNOSIS — Z8711 Personal history of peptic ulcer disease: Secondary | ICD-10-CM | POA: Insufficient documentation

## 2013-02-17 DIAGNOSIS — M549 Dorsalgia, unspecified: Secondary | ICD-10-CM | POA: Insufficient documentation

## 2013-02-17 DIAGNOSIS — X58XXXA Exposure to other specified factors, initial encounter: Secondary | ICD-10-CM | POA: Insufficient documentation

## 2013-02-17 MED ORDER — HYDROCODONE-ACETAMINOPHEN 5-325 MG PO TABS: 1.0000 | ORAL_TABLET | ORAL | Status: DC | PRN

## 2013-02-17 MED ORDER — ERYTHROMYCIN BASE 250 MG PO TABS: 250.0000 mg | ORAL_TABLET | Freq: Four times a day (QID) | ORAL | Status: DC

## 2013-02-17 NOTE — ED Provider Notes (Signed)
History    CSN: [id number] Arrival date & time 02/17/13  1054  First MD Initiated Contact with Patient 02/17/13 1114     Chief Complaint  Patient presents with  . Dental Pain   (Consider location/radiation/quality/duration/timing/severity/associated sxs/prior Treatment) Patient is a 32 y.o. female presenting with tooth pain. The history is provided by the patient.  Dental Pain Location:  Upper Upper teeth location:  9/LU central incisor Severity:  Severe Duration:  1 week Timing:  Constant Chronicity:  New Context: dental caries, enamel fracture and poor dentition   Previous work-up:  Dental exam Relieved by:  Nothing Worsened by:  Hot food/drink, cold food/drink, pressure and touching Ineffective treatments:  Acetaminophen Associated symptoms: no headaches and no neck pain  Fever: low grade.    Jenna Henderson is a 32 y.o. female presents to the ED with dental pain. She states that last week was eating and the upper left front tooth broke. She went to Dr. Gerilyn Pilgrim, DDS and was referred to an oral surgeon to have the tooth removed. She can not get an appointment until July 21 with Dr. Jarold Motto. She comes today for pain management.  Past Medical History  Diagnosis Date  . MS (multiple sclerosis)   . Thyroid disease   . Hypertension   . Peptic ulcer   . Crohn's disease   . Migraine   . Chronic back pain   . Dental caries   . UTI (lower urinary tract infection)   . Chronic back pain    Past Surgical History  Procedure Laterality Date  . Cholecystectomy    . Cesarean section    . Mouth surgery      removal of 8 teeth  . Tubal ligation     No family history on file. History  Substance Use Topics  . Smoking status: Never Smoker   . Smokeless tobacco: Not on file  . Alcohol Use: No   OB History   Grav Para Term Preterm Abortions TAB SAB Ect Mult Living            3     Review of Systems  Constitutional: Fever: low grade.  HENT: Positive for dental problem.  Negative for neck pain and neck stiffness.   Gastrointestinal: Negative for nausea, vomiting and abdominal pain.  Skin: Negative for rash.  Neurological: Negative for headaches.  Psychiatric/Behavioral: The patient is not nervous/anxious.     Allergies  Prednisone; Aspirin; Nsaids; Penicillins; Ultram; Imitrex; Ketorolac tromethamine; and Other  Home Medications   Current Outpatient Rx  Name  Route  Sig  Dispense  Refill  . acetaminophen (TYLENOL) 500 MG tablet   Oral   Take 1,000 mg by mouth every 6 (six) hours as needed for pain.         Marland Kitchen ALPRAZolam (XANAX) 1 MG tablet   Oral   Take 1 mg by mouth 3 (three) times daily.         . cyclobenzaprine (FLEXERIL) 10 MG tablet   Oral   Take 1 tablet (10 mg total) by mouth 2 (two) times daily as needed for muscle spasms.   20 tablet   0   . dicyclomine (BENTYL) 10 MG capsule   Oral   Take 10 mg by mouth 3 (three) times daily.          . Interferon Beta-1b (BETASERON [place])   Subcutaneous   Inject 1 application into the skin every other day.         . metroNIDAZOLE (  FLAGYL) 500 MG tablet   Oral   Take 1 tablet (500 mg total) by mouth 2 (two) times daily.   14 tablet   0   . omeprazole (PRILOSEC) 20 MG capsule   Oral   Take 20 mg by mouth daily.          BP 128/72  Pulse 80  Temp(Src) 99 F (37.2 C) (Oral)  Resp 20  Ht 5\' 5"  (1.651 m)  Wt 263 lb (119.296 kg)  BMI 43.77 kg/m2  SpO2 100%  LMP 02/08/2013 Physical Exam  Nursing note and vitals reviewed. Constitutional: She is oriented to person, place, and time. No distress.  Obese   HENT:  Mouth/Throat: Uvula is midline, oropharynx is clear and moist and mucous membranes are normal. Dental caries present.    Left upper front tooth broken, tender on exam.  Eyes: EOM are normal.  Neck: Neck supple.  Cardiovascular: Normal rate.   Pulmonary/Chest: Effort normal.  Abdominal: Soft. There is no tenderness.  Musculoskeletal: Normal range of motion.    Lymphadenopathy:    She has no cervical adenopathy.  Neurological: She is alert and oriented to person, place, and time. No cranial nerve deficit.  Skin: Skin is warm and dry.  Psychiatric: She has a normal mood and affect. Her behavior is normal.    ED Course  Procedures  MDM  32 y.o. female with dental pain and broken tooth.  Discussed with the patient clinical findings and need for regular dental care. All questioned fully answered.    Medication List    TAKE these medications       erythromycin 250 MG tablet  Commonly known as:  E-MYCIN  Take 1 tablet (250 mg total) by mouth every 6 (six) hours.     HYDROcodone-acetaminophen 5-325 MG per tablet  Commonly known as:  NORCO/VICODIN  Take 1 tablet by mouth every 4 (four) hours as needed.      ASK your doctor about these medications       acetaminophen 500 MG tablet  Commonly known as:  TYLENOL  Take 1,000 mg by mouth every 6 (six) hours as needed for pain.     ALPRAZolam 1 MG tablet  Commonly known as:  XANAX  Take 1 mg by mouth 3 (three) times daily.     BETASERON [place]  Inject 1 application into the skin every other day.     dicyclomine 10 MG capsule  Commonly known as:  BENTYL  Take 10 mg by mouth 3 (three) times daily.     omeprazole 20 MG capsule  Commonly known as:  PRILOSEC  Take 20 mg by mouth daily.        Pharmacy called and Erythromycin interacts with one of the patient's medications that is not on our list of current medications. Will change to Clindamycin which was listed as an allergy but she told the pharmacy that she just gets occasional GI upset when she takes it.   [facility] Orlene Och, [place] 02/17/13 1408

## 2013-02-17 NOTE — ED Notes (Signed)
Pt reports dental pain for 1 week. Multiple visits to the ed for same

## 2013-02-17 NOTE — ED Provider Notes (Signed)
Medical screening examination/treatment/procedure(s) were performed by non-physician practitioner and as supervising physician I was immediately available for consultation/collaboration.  Lyanne Co, MD 02/17/13 [phone]

## 2013-02-17 NOTE — ED Notes (Signed)
Dental Pain  ,Has appt with dentist on the 21st for mult extractions.

## 2013-03-03 ENCOUNTER — Emergency Department (HOSPITAL_COMMUNITY): Payer: Medicaid Other

## 2013-03-03 ENCOUNTER — Emergency Department (INDEPENDENT_AMBULATORY_CARE_PROVIDER_SITE_OTHER)
Admission: EM | Admit: 2013-03-03 | Discharge: 2013-03-03 | Discharge status: Against Medical Advice | Payer: Medicaid Other | Source: Home / Self Care | Attending: Emergency Medicine | Admitting: Emergency Medicine

## 2013-03-03 ENCOUNTER — Encounter (HOSPITAL_COMMUNITY): Payer: Self-pay | Admitting: *Deleted

## 2013-03-03 DIAGNOSIS — S99911A Unspecified injury of right ankle, initial encounter: Secondary | ICD-10-CM

## 2013-03-03 DIAGNOSIS — S8990XA Unspecified injury of unspecified lower leg, initial encounter: Secondary | ICD-10-CM

## 2013-03-03 DIAGNOSIS — K089 Disorder of teeth and supporting structures, unspecified: Secondary | ICD-10-CM

## 2013-03-03 DIAGNOSIS — T148XXA Other injury of unspecified body region, initial encounter: Secondary | ICD-10-CM

## 2013-03-03 DIAGNOSIS — IMO0002 Reserved for concepts with insufficient information to code with codable children: Secondary | ICD-10-CM

## 2013-03-03 DIAGNOSIS — K0889 Other specified disorders of teeth and supporting structures: Secondary | ICD-10-CM

## 2013-03-03 NOTE — ED Notes (Signed)
Pt left AMA. Mw,cma

## 2013-03-03 NOTE — ED Provider Notes (Signed)
Chief Complaint:   Chief Complaint  Patient presents with  . Dental Pain    History of Present Illness:   Jenna Henderson is a 32 year old female who presents today because of dental pain. The patient states she cracked her left, upper incisor this past Monday which is 5 days ago. Ever since then she's had pain in the incisor. She has no appointment to see a dentist later on next week , but she needs something for pain. She requests Percocet. She has radiation the pain towards her ear and throbbing. There is no swelling the gingiva, purulent drainage, headache, fever, or difficulty swallowing or breathing. When her chart was reviewed further, it appears that she was at the emergency room earlier this month with the same complaints. On one occasion she was dismissed from the emergency room without any pain medication since she had been caught in several lives it was felt that she was exhibiting drug-seeking behavior.  She also complains today of right ankle and foot pain. The patient states she has multiple sclerosis. While she was on her way here, she tripped and sustained a small skin tear to the plantar surface of her right foot and also has aching in her right ankle.  The patient has been to the emergency room many times this year for a variety of pain complaints including lower back pain and tooth pain. She is seeing a doctor for pain control and received 90 hydrocodone/APAP 10/325 on 02/20/13, which was just 11 days ago. Therefore she does not need any further pain medication. Checking the state database, reveals that she has received prescriptions for pain medication from several ER providers and from a chronic pain clinic, these were written by a doctor Tonye Royalty who has been providing her with oxycodone/acetaminophen 10/325 or hydrocodone/acetaminophen 10/325, #90 every 30 days.  Review of Systems:  Other than noted above, the patient denies any of the following symptoms: Systemic:  No  fever, chills,  Or sweats. ENT:  No headache, ear ache, sore throat, nasal congestion, facial pain, or swelling. Lymphatic:  No adenopathy. Lungs:  No coughing, wheezing or shortness of breath.  PMFSH:  Past medical history, family history, social history, meds, and allergies were reviewed. She takes Betaseron, Prilosec, and Bentyl. She has a history of multiple sclerosis and Crohn's disease.  Physical Exam:   Vital signs:  BP 126/81  Pulse 74  Temp(Src) 97.3 F (36.3 C) (Oral)  Resp 20  SpO2 100%  LMP 02/27/2013 General:  Alert, oriented, in no distress. ENT:  TMs and canals normal.  Nasal mucosa normal. Mouth exam:  All of her upper teeth are carious. There is no swelling of the gingiva, no collection of pus. I do not see an acute injury or crack on the tooth. There is no swelling of the floor the mouth, the pharynx is clear and widely patent. Neck:  No swelling or adenopathy. Lungs:  Breath sounds clear and equal bilaterally.  No wheezes, rales or rhonchi. Heart:  Regular rhythm.  No gallops or murmers. Extremities: She has a small skin tear on the plantar surface of her right foot. The right ankle is tender to palpation and slightly swollen. Skin:  Clear, warm and dry.   Course in Urgent Care Center:   An x-ray of the ankle was ordered. I told her I needed to check the state database before prescribing any pain medication for her. The x-ray tech came back again her about 20 minutes after I had put in  the order for the x-ray. She was unable to find her, and the front office personnel stated that she had left saying that she had to wait "1 hour" for the x-ray, therefore she was leaving. She was seen to be sitting in her vehicle in the parking lot thereafter talking with another patient, but she did not attempt to return for further evaluation. Therefore the patient was discharged AGAINST MEDICAL ADVICE.  Assessment:  The primary encounter diagnosis was Ankle injury, right, initial  encounter. Diagnoses of Skin tear and Pain, dental were also pertinent to this visit.  I think she does really have dental pain and may have sprained ankle and a skin tear. I had hoped to try to treat this, but she left AGAINST MEDICAL ADVICE before I was able to obtain an x-ray or to give her any treatment. I am concerned about possible drug-seeking behavior.  Plan:   1.  The following meds were prescribed:   Discharge Medication List as of 03/03/2013  6:50 PM        Reuben Likes, MD 03/03/13 2228

## 2013-03-03 NOTE — ED Notes (Signed)
Pt  Has  Dental  Caries   She  States  Has  An  appt   In  About 8  Days  To  See   A   Dentist  She  Reports    4 days  Ago  She    May  Have  Chipped  Part  Of  Her  Front  Teeth  While  Eating        She  Has  Pain        She  Also  Reports  r  Foot     Pain as  Well     From a  Sore

## 2013-03-13 ENCOUNTER — Emergency Department (HOSPITAL_COMMUNITY)
Admission: EM | Admit: 2013-03-13 | Discharge: 2013-03-13 | Disposition: A | Discharge status: Home / Self Care | Payer: Medicaid Other | Attending: Emergency Medicine | Admitting: Emergency Medicine

## 2013-03-13 ENCOUNTER — Encounter (HOSPITAL_COMMUNITY): Payer: Self-pay

## 2013-03-13 DIAGNOSIS — Z765 Malingerer [conscious simulation]: Secondary | ICD-10-CM | POA: Insufficient documentation

## 2013-03-13 DIAGNOSIS — Z8639 Personal history of other endocrine, nutritional and metabolic disease: Secondary | ICD-10-CM | POA: Insufficient documentation

## 2013-03-13 DIAGNOSIS — Z87440 Personal history of urinary (tract) infections: Secondary | ICD-10-CM | POA: Insufficient documentation

## 2013-03-13 DIAGNOSIS — Z880 Allergy status to penicillin: Secondary | ICD-10-CM | POA: Insufficient documentation

## 2013-03-13 DIAGNOSIS — K089 Disorder of teeth and supporting structures, unspecified: Secondary | ICD-10-CM | POA: Insufficient documentation

## 2013-03-13 DIAGNOSIS — Z862 Personal history of diseases of the blood and blood-forming organs and certain disorders involving the immune mechanism: Secondary | ICD-10-CM | POA: Insufficient documentation

## 2013-03-13 DIAGNOSIS — Z8719 Personal history of other diseases of the digestive system: Secondary | ICD-10-CM | POA: Insufficient documentation

## 2013-03-13 DIAGNOSIS — Z79899 Other long term (current) drug therapy: Secondary | ICD-10-CM | POA: Insufficient documentation

## 2013-03-13 DIAGNOSIS — K0889 Other specified disorders of teeth and supporting structures: Secondary | ICD-10-CM

## 2013-03-13 DIAGNOSIS — G8929 Other chronic pain: Secondary | ICD-10-CM | POA: Insufficient documentation

## 2013-03-13 DIAGNOSIS — I10 Essential (primary) hypertension: Secondary | ICD-10-CM | POA: Insufficient documentation

## 2013-03-13 DIAGNOSIS — Z8669 Personal history of other diseases of the nervous system and sense organs: Secondary | ICD-10-CM | POA: Insufficient documentation

## 2013-03-13 DIAGNOSIS — Z8679 Personal history of other diseases of the circulatory system: Secondary | ICD-10-CM | POA: Insufficient documentation

## 2013-03-13 NOTE — ED Notes (Signed)
Pt here for chronic dental pain. Stated she has a ride in the lobby.

## 2013-03-13 NOTE — ED Notes (Signed)
Evaluated by Dr Juleen China, after evaluation walked out of  room

## 2013-03-13 NOTE — ED Provider Notes (Signed)
CSN: [id number]     Arrival date & time 03/13/13  1818 History    31yF with toothache. Chronic. Reports worse in past 3-4 days. Denies trauma. No fever or chills. No difficulty breathing or swallowing. Has been taking tylenol w/o much relief. Reports has upcoming dental appointment.    First MD Initiated Contact with Patient 03/13/13 1832     Chief Complaint  Patient presents with  . Dental Pain   (Consider location/radiation/quality/duration/timing/severity/associated sxs/prior Treatment) HPI  Past Medical History  Diagnosis Date  . MS (multiple sclerosis)   . Thyroid disease   . Hypertension   . Peptic ulcer   . Crohn's disease   . Migraine   . Chronic back pain   . Dental caries   . UTI (lower urinary tract infection)   . Chronic back pain    Past Surgical History  Procedure Laterality Date  . Cholecystectomy    . Cesarean section    . Mouth surgery      removal of 8 teeth  . Tubal ligation     No family history on file. History  Substance Use Topics  . Smoking status: Never Smoker   . Smokeless tobacco: Not on file  . Alcohol Use: No   OB History   Grav Para Term Preterm Abortions TAB SAB Ect Mult Living            3     Review of Systems  All systems reviewed and negative, other than as noted in HPI.   Allergies  Prednisone; Aspirin; Nsaids; Penicillins; Ultram; Imitrex; Ketorolac tromethamine; and Other  Home Medications   Current Outpatient Rx  Name  Route  Sig  Dispense  Refill  . acetaminophen (TYLENOL) 500 MG tablet   Oral   Take 1,000 mg by mouth every 6 (six) hours as needed for pain.         Marland Kitchen ALPRAZolam (XANAX) 1 MG tablet   Oral   Take 1 mg by mouth 3 (three) times daily.         Marland Kitchen dicyclomine (BENTYL) 10 MG capsule   Oral   Take 10 mg by mouth 3 (three) times daily.          Marland Kitchen erythromycin (E-MYCIN) 250 MG tablet   Oral   Take 1 tablet (250 mg total) by mouth every 6 (six) hours.   28 tablet   0   .  HYDROcodone-acetaminophen (NORCO/VICODIN) 5-325 MG per tablet   Oral   Take 1 tablet by mouth every 4 (four) hours as needed.   6 tablet   0   . Interferon Beta-1b (BETASERON [place])   Subcutaneous   Inject 1 application into the skin every other day.         Marland Kitchen omeprazole (PRILOSEC) 20 MG capsule   Oral   Take 20 mg by mouth daily.          BP 121/76  Pulse 80  Temp(Src) 98.5 F (36.9 C) (Oral)  Resp 18  Ht 5\' 5"  (1.651 m)  Wt 263 lb (119.296 kg)  BMI 43.77 kg/m2  SpO2 100%  LMP 02/27/2013 Physical Exam  Nursing note and vitals reviewed. Constitutional: She appears well-developed and well-nourished. No distress.  HENT:  Head: Normocephalic and atraumatic.  Generally poor dentition. Multiple missing teeth. No evidence of deep space head/neck infection.   Eyes: Conjunctivae are normal. Right eye exhibits no discharge. Left eye exhibits no discharge.  Neck: Neck supple.  Cardiovascular: Normal rate, regular rhythm  and normal heart sounds.  Exam reveals no gallop and no friction rub.   No murmur heard. Pulmonary/Chest: Effort normal and breath sounds normal. No respiratory distress.  Abdominal: Soft. She exhibits no distension. There is no tenderness.  Musculoskeletal: She exhibits no edema and no tenderness.  Neurological: She is alert.  Skin: Skin is warm and dry.  Psychiatric: She has a normal mood and affect. Her behavior is normal. Thought content normal.    ED Course   Procedures (including critical care time)  Labs Reviewed - No data to display No results found. 1. Pain, dental   2. Drug-seeking behavior     MDM  31yF with dental pain. Has dental decay, but no evidence of deep space head/neck infection. Clear pattern of drug-seeking behavior. Multiple evaluations for same at various facilities. In the past 18 months she has had ~15 ED visits specifically related to dental pain and at least as many for other various pain issues. I offered her a dental block but  she declined. Multiple prescriptions from various providers. Often has inconsistent stories. On previous visit she actually gave her keys to a stranger in the waiting room lobby to hold for her after she was asked how she would be getting home if she was given pain medication. On another visit she eloped from ED after being told that provider wanted to check drug database before prescribing her medication. She walked out saying she didn't want to wait for an x-ray, but then was then subsequently found sitting in the parking lot some time later and no made apparent attempt to return. Pt left exam room right after I did today. She did not wait for her discharge paperwork.   Raeford Razor, MD 03/13/13 [phone]

## 2015-08-16 ENCOUNTER — Emergency Department (HOSPITAL_COMMUNITY): Payer: Medicaid - Out of State

## 2015-08-16 ENCOUNTER — Emergency Department (HOSPITAL_COMMUNITY)
Admission: EM | Admit: 2015-08-16 | Discharge: 2015-08-16 | Disposition: A | Discharge status: Home / Self Care | Payer: Medicaid - Out of State | Attending: Emergency Medicine | Admitting: Emergency Medicine

## 2015-08-16 ENCOUNTER — Encounter (HOSPITAL_COMMUNITY): Payer: Self-pay | Admitting: Emergency Medicine

## 2015-08-16 DIAGNOSIS — G8929 Other chronic pain: Secondary | ICD-10-CM | POA: Diagnosis not present

## 2015-08-16 DIAGNOSIS — Z87440 Personal history of urinary (tract) infections: Secondary | ICD-10-CM | POA: Insufficient documentation

## 2015-08-16 DIAGNOSIS — Z79899 Other long term (current) drug therapy: Secondary | ICD-10-CM | POA: Insufficient documentation

## 2015-08-16 DIAGNOSIS — I10 Essential (primary) hypertension: Secondary | ICD-10-CM | POA: Diagnosis not present

## 2015-08-16 DIAGNOSIS — R10A0 Flank pain, unspecified side: Secondary | ICD-10-CM

## 2015-08-16 DIAGNOSIS — R109 Unspecified abdominal pain: Secondary | ICD-10-CM

## 2015-08-16 DIAGNOSIS — Z8711 Personal history of peptic ulcer disease: Secondary | ICD-10-CM | POA: Insufficient documentation

## 2015-08-16 DIAGNOSIS — R110 Nausea: Secondary | ICD-10-CM | POA: Diagnosis not present

## 2015-08-16 DIAGNOSIS — Z3202 Encounter for pregnancy test, result negative: Secondary | ICD-10-CM | POA: Insufficient documentation

## 2015-08-16 DIAGNOSIS — Z8719 Personal history of other diseases of the digestive system: Secondary | ICD-10-CM | POA: Insufficient documentation

## 2015-08-16 DIAGNOSIS — Z880 Allergy status to penicillin: Secondary | ICD-10-CM | POA: Insufficient documentation

## 2015-08-16 DIAGNOSIS — G35 Multiple sclerosis: Secondary | ICD-10-CM | POA: Diagnosis not present

## 2015-08-16 DIAGNOSIS — Z8639 Personal history of other endocrine, nutritional and metabolic disease: Secondary | ICD-10-CM | POA: Diagnosis not present

## 2015-08-16 LAB — COMPREHENSIVE METABOLIC PANEL
ALK PHOS: 76 U/L (ref 38–126)
ALT: 15 U/L (ref 14–54)
ANION GAP: 5 (ref 5–15)
AST: 14 U/L — ABNORMAL LOW (ref 15–41)
Albumin: 3.8 g/dL (ref 3.5–5.0)
BILIRUBIN TOTAL: 0.5 mg/dL (ref 0.3–1.2)
BUN: 18 mg/dL (ref 6–20)
CALCIUM: 8.7 mg/dL — AB (ref 8.9–10.3)
CO2: 22 mmol/L (ref 22–32)
CREATININE: 0.67 mg/dL (ref 0.44–1.00)
Chloride: 112 mmol/L — ABNORMAL HIGH (ref 101–111)
GFR calc non Af Amer: >60 mL/min (ref >60)
Glucose, Bld: 100 mg/dL — ABNORMAL HIGH (ref 65–99)
Potassium: 3.7 mmol/L (ref 3.5–5.1)
Sodium: 139 mmol/L (ref 135–145)
Total Protein: 6.9 g/dL (ref 6.5–8.1)

## 2015-08-16 LAB — URINALYSIS, ROUTINE W REFLEX MICROSCOPIC
Bilirubin Urine: NEGATIVE
GLUCOSE, UA: NEGATIVE mg/dL
Hgb urine dipstick: NEGATIVE
Ketones, ur: NEGATIVE mg/dL
LEUKOCYTES UA: NEGATIVE
Nitrite: NEGATIVE
PH: 7 (ref 5.0–8.0)
Protein, ur: NEGATIVE mg/dL
SPECIFIC GRAVITY, URINE: 1.02 (ref 1.005–1.030)

## 2015-08-16 LAB — CBC
HEMATOCRIT: 34 % — AB (ref 36.0–46.0)
HEMOGLOBIN: 11 g/dL — AB (ref 12.0–15.0)
MCH: 27.5 pg (ref 26.0–34.0)
MCHC: 32.4 g/dL (ref 30.0–36.0)
MCV: 85 fL (ref 78.0–100.0)
Platelets: 268 10*3/uL (ref 150–400)
RBC: 4 MIL/uL (ref 3.87–5.11)
RDW: 15.4 % (ref 11.5–15.5)
WBC: 8.5 10*3/uL (ref 4.0–10.5)

## 2015-08-16 LAB — PREGNANCY, URINE: Preg Test, Ur: NEGATIVE

## 2015-08-16 MED ORDER — OXYCODONE-ACETAMINOPHEN 5-325 MG PO TABS
2.0000 | ORAL_TABLET | Freq: Once | ORAL | Status: AC
  Administered 2015-08-16: 2 via ORAL
  Filled 2015-08-16: qty 2

## 2015-08-16 MED ORDER — ONDANSETRON 8 MG PO TBDP
8.0000 mg | ORAL_TABLET | Freq: Once | ORAL | Status: AC
  Administered 2015-08-16: 8 mg via ORAL
  Filled 2015-08-16: qty 1

## 2015-08-16 MED ORDER — MORPHINE SULFATE (PF) 4 MG/ML IV SOLN
4.0000 mg | Freq: Once | INTRAVENOUS | Status: AC
  Administered 2015-08-16: 4 mg via INTRAVENOUS
  Filled 2015-08-16: qty 1

## 2015-08-16 MED ORDER — ONDANSETRON 8 MG PO TBDP: 8.0000 mg | ORAL_TABLET | Freq: Three times a day (TID) | ORAL | Status: AC | PRN

## 2015-08-16 MED ORDER — SODIUM CHLORIDE 0.9 % IV BOLUS (SEPSIS)
1000.0000 mL | Freq: Once | INTRAVENOUS | Status: AC
  Administered 2015-08-16: 1000 mL via INTRAVENOUS

## 2015-08-16 MED ORDER — ONDANSETRON HCL 4 MG/2ML IJ SOLN
4.0000 mg | Freq: Once | INTRAMUSCULAR | Status: AC
  Administered 2015-08-16: 4 mg via INTRAVENOUS
  Filled 2015-08-16: qty 2

## 2015-08-16 MED ORDER — ACETAMINOPHEN 500 MG PO TABS: 500.0000 mg | ORAL_TABLET | Freq: Four times a day (QID) | ORAL | Status: AC | PRN

## 2015-08-16 NOTE — ED Provider Notes (Signed)
CSN: [id number]     Arrival date & time 08/16/15  1451 History   First MD Initiated Contact with Patient 08/16/15 1508     Chief Complaint  Patient presents with  . Flank Pain     HPI Patient presents to the emergency department complaining of right flank pain with radiation towards her right groin over the past 2 days.  She reports occasional nausea without vomiting.  She denies diarrhea.  No dysuria or urinary frequency.  She does have long-standing history of prior chronic back pain for which she used to be managed in the pain clinic.  She is no longer under the management of the pain physician.  She's tried anti-inflammatories without improvement in her symptoms.  No fevers or chills.  No chest pain shortness breath.  Denies upper abdominal pain.  Pain is moderate in severity.   Past Medical History  Diagnosis Date  . MS (multiple sclerosis) (HCC)   . Thyroid disease   . Hypertension   . Peptic ulcer   . Crohn's disease (HCC)   . Migraine   . Chronic back pain   . Dental caries   . UTI (lower urinary tract infection)   . Chronic back pain    Past Surgical History  Procedure Laterality Date  . Cholecystectomy    . Cesarean section    . Mouth surgery      removal of 8 teeth  . Tubal ligation     History reviewed. No pertinent family history. Social History  Substance Use Topics  . Smoking status: Never Smoker   . Smokeless tobacco: None  . Alcohol Use: No   OB History    Gravida Para Term Preterm AB TAB SAB Ectopic Multiple Living            3     Review of Systems  All other systems reviewed and are negative.     Allergies  Prednisone; Aspirin; Nsaids; Penicillins; Ultram; Imitrex; Ketorolac tromethamine; and Other  Home Medications   Prior to Admission medications   Medication Sig Start Date End Date Taking? Authorizing Provider  acetaminophen (TYLENOL) 500 MG tablet Take 1,000 mg by mouth every 6 (six) hours as needed for pain.    Historical Provider,  MD  ALPRAZolam Prudy Feeler) 1 MG tablet Take 1 mg by mouth 3 (three) times daily.    Historical Provider, MD  dicyclomine (BENTYL) 10 MG capsule Take 10 mg by mouth 3 (three) times daily.     Historical Provider, MD  erythromycin (E-MYCIN) 250 MG tablet Take 1 tablet (250 mg total) by mouth every 6 (six) hours. 02/17/13   Hope Orlene Och, NP  HYDROcodone-acetaminophen (NORCO/VICODIN) 5-325 MG per tablet Take 1 tablet by mouth every 4 (four) hours as needed. 02/17/13   Hope Orlene Och, NP  Interferon Beta-1b (BETASERON [place]) Inject 1 application into the skin every other day.    Historical Provider, MD  omeprazole (PRILOSEC) 20 MG capsule Take 20 mg by mouth daily.    Historical Provider, MD   BP 131/79 mmHg  Pulse 132  Temp(Src) 98.6 F (37 C) (Oral)  Resp 16  Ht  (1.651 m)  Wt 217 lb (98.431 kg)  BMI 36.11 kg/m2  SpO2 100%  LMP 07/30/2015 Physical Exam  Constitutional: She is oriented to person, place, and time. She appears well-developed and well-nourished. No distress.  HENT:  Head: Normocephalic and atraumatic.  Eyes: EOM are normal.  Neck: Normal range of motion.  Cardiovascular: Normal rate, regular  rhythm and normal heart sounds.   Pulmonary/Chest: Effort normal and breath sounds normal.  Abdominal: Soft. She exhibits no distension. There is no tenderness.  Genitourinary:  No CVA tenderness  Musculoskeletal: Normal range of motion.  Neurological: She is alert and oriented to person, place, and time.  Skin: Skin is warm and dry.  Psychiatric: She has a normal mood and affect. Judgment normal.  Nursing note and vitals reviewed.   ED Course  Procedures (including critical care time) Labs Review Labs Reviewed  URINALYSIS, ROUTINE W REFLEX MICROSCOPIC (NOT AT [facility]) - Abnormal; Notable for the following:    APPearance HAZY (*)    All other components within normal limits  CBC - Abnormal; Notable for the following:    Hemoglobin 11.0 (*)    HCT 34.0 (*)    All other components  within normal limits  COMPREHENSIVE METABOLIC PANEL - Abnormal; Notable for the following:    Chloride 112 (*)    Glucose, Bld 100 (*)    Calcium 8.7 (*)    AST 14 (*)    All other components within normal limits  PREGNANCY, URINE    Imaging Review Ct Renal Stone Study  08/16/2015  CLINICAL DATA:  Right lower quadrant abdominal pain. History of renal calculi, peptic ulcer, and Crohn's disease. EXAM: CT ABDOMEN AND PELVIS WITHOUT CONTRAST TECHNIQUE: Multidetector CT imaging of the abdomen and pelvis was performed following the standard protocol without IV contrast. COMPARISON:  02/06/2012 FINDINGS: Lower chest:  Unremarkable Hepatobiliary: Cholecystectomy Pancreas: Unremarkable Spleen: Unremarkable Adrenals/Urinary Tract: Unremarkable.  No stones identified. Stomach/Bowel: Prominent stool throughout the colon favors constipation. There is faint stranding around the terminal ileum, images 48-53 of series 4, possibly a manifestation of active Crohn' s disease. The appendix is not well seen. Vascular/Lymphatic: Unremarkable Reproductive: Unremarkable Other: Unremarkable Musculoskeletal: Unremarkable IMPRESSION: 1. Low-level stranding around the terminal ileum, potentially a manifestation of active Crohn' s disease. 2. Mild prominence of stool within the colon, query constipation. Electronically Signed   By: Gaylyn Rong M.D.   On: 08/16/2015 18:34   I have personally reviewed and evaluated these images and lab results as part of my medical decision-making.   EKG Interpretation None      MDM   Final diagnoses:  Flank pain  Abdominal pain, unspecified abdominal location   CT scan to evaluate for ureteral stone given colicky nature of pain and radiation of pain.  6:42 PM Nonspecific stranding in the terminal ileum.  No history of Crohn's disease.  Patient be referred to gastroenterology and referred back to her primary care physician.  Patient has extensive allergy list which makes  treating her pain difficult.  I will ask her to take Tylenol for the pain and Zofran for the nausea.  She understands to return to the ER for new or worsening symptoms.     Azalia Bilis, MD 08/16/15 [phone]

## 2015-08-16 NOTE — Discharge Instructions (Signed)

## 2015-08-16 NOTE — ED Notes (Signed)
Patient given discharge instruction, verbalized understand. IV removed, band aid applied. Patient ambulatory out of the department.  

## 2015-08-16 NOTE — ED Notes (Signed)
Assisted pt to bedside commode.

## 2015-08-16 NOTE — ED Notes (Signed)
Patient complaining of right flank pain x 2 days. States she has history of back pain "but this is not the same because it comes around to my groin area." Denies dysuria.
